# Patient Record
Sex: Male | Born: 1960
Health system: Southern US, Community
[De-identification: ages and names within clinical notes are randomized; demographics above are authoritative.]

## PROBLEM LIST (undated history)

## (undated) DIAGNOSIS — T884XXA Failed or difficult intubation, initial encounter: Secondary | ICD-10-CM

## (undated) DIAGNOSIS — F419 Anxiety disorder, unspecified: Secondary | ICD-10-CM

## (undated) DIAGNOSIS — I499 Cardiac arrhythmia, unspecified: Secondary | ICD-10-CM

## (undated) DIAGNOSIS — E669 Obesity, unspecified: Secondary | ICD-10-CM

## (undated) DIAGNOSIS — I10 Essential (primary) hypertension: Secondary | ICD-10-CM

## (undated) DIAGNOSIS — M1990 Unspecified osteoarthritis, unspecified site: Secondary | ICD-10-CM

## (undated) DIAGNOSIS — I209 Angina pectoris, unspecified: Secondary | ICD-10-CM

## (undated) DIAGNOSIS — I219 Acute myocardial infarction, unspecified: Secondary | ICD-10-CM

## (undated) DIAGNOSIS — G4730 Sleep apnea, unspecified: Secondary | ICD-10-CM

## (undated) DIAGNOSIS — I2510 Atherosclerotic heart disease of native coronary artery without angina pectoris: Secondary | ICD-10-CM

## (undated) DIAGNOSIS — E785 Hyperlipidemia, unspecified: Secondary | ICD-10-CM

## (undated) DIAGNOSIS — K5792 Diverticulitis of intestine, part unspecified, without perforation or abscess without bleeding: Secondary | ICD-10-CM

## (undated) DIAGNOSIS — G709 Myoneural disorder, unspecified: Secondary | ICD-10-CM

## (undated) DIAGNOSIS — J939 Pneumothorax, unspecified: Secondary | ICD-10-CM

## (undated) HISTORY — PX: CORONARY ANGIOPLASTY: SHX604

## (undated) HISTORY — PX: TONSILLECTOMY: SUR1361

## (undated) HISTORY — DX: Hyperlipidemia, unspecified: E78.5

## (undated) HISTORY — DX: Anxiety disorder, unspecified: F41.9

## (undated) HISTORY — DX: Myoneural disorder, unspecified: G70.9

## (undated) HISTORY — PX: CARDIAC CATHETERIZATION: SHX172

## (undated) HISTORY — DX: Unspecified osteoarthritis, unspecified site: M19.90

## (undated) HISTORY — DX: Atherosclerotic heart disease of native coronary artery without angina pectoris: I25.10

## (undated) HISTORY — DX: Obesity, unspecified: E66.9

## (undated) HISTORY — PX: OTHER SURGICAL HISTORY: SHX169

## (undated) HISTORY — DX: Diverticulitis of intestine, part unspecified, without perforation or abscess without bleeding: K57.92

## (undated) HISTORY — PX: JOINT REPLACEMENT: SHX530

---

## 2004-10-01 DIAGNOSIS — S270XXA Traumatic pneumothorax, initial encounter: Secondary | ICD-10-CM | POA: Insufficient documentation

## 2004-10-14 ENCOUNTER — Inpatient Hospital Stay (HOSPITAL_COMMUNITY): Admission: EM | Admit: 2004-10-14 | Discharge: 2004-10-17 | Payer: Self-pay | Admitting: Emergency Medicine

## 2004-10-27 ENCOUNTER — Encounter: Admission: RE | Admit: 2004-10-27 | Discharge: 2004-10-27 | Payer: Self-pay | Admitting: Surgery

## 2004-11-01 DIAGNOSIS — J939 Pneumothorax, unspecified: Secondary | ICD-10-CM

## 2004-11-01 HISTORY — PX: PLEURAL SCARIFICATION: SHX748

## 2004-11-01 HISTORY — DX: Pneumothorax, unspecified: J93.9

## 2005-07-01 ENCOUNTER — Ambulatory Visit: Payer: Self-pay | Admitting: Family Medicine

## 2005-07-08 ENCOUNTER — Encounter: Admission: RE | Admit: 2005-07-08 | Discharge: 2005-07-08 | Payer: Self-pay | Admitting: Family Medicine

## 2005-07-08 ENCOUNTER — Inpatient Hospital Stay (HOSPITAL_COMMUNITY): Admission: AD | Admit: 2005-07-08 | Discharge: 2005-07-13 | Payer: Self-pay | Admitting: Cardiothoracic Surgery

## 2005-07-09 ENCOUNTER — Encounter (INDEPENDENT_AMBULATORY_CARE_PROVIDER_SITE_OTHER): Payer: Self-pay | Admitting: *Deleted

## 2005-07-23 ENCOUNTER — Encounter: Admission: RE | Admit: 2005-07-23 | Discharge: 2005-07-23 | Payer: Self-pay | Admitting: Cardiothoracic Surgery

## 2005-12-26 ENCOUNTER — Emergency Department (HOSPITAL_COMMUNITY): Admission: EM | Admit: 2005-12-26 | Discharge: 2005-12-26 | Payer: Self-pay | Admitting: Family Medicine

## 2006-12-20 ENCOUNTER — Ambulatory Visit: Payer: Self-pay | Admitting: Internal Medicine

## 2006-12-21 ENCOUNTER — Inpatient Hospital Stay (HOSPITAL_COMMUNITY): Admission: EM | Admit: 2006-12-21 | Discharge: 2006-12-24 | Payer: Self-pay | Admitting: Emergency Medicine

## 2007-01-12 ENCOUNTER — Encounter (HOSPITAL_COMMUNITY): Admission: RE | Admit: 2007-01-12 | Discharge: 2007-03-17 | Payer: Self-pay | Admitting: Cardiovascular Disease

## 2007-05-09 ENCOUNTER — Observation Stay (HOSPITAL_COMMUNITY): Admission: RE | Admit: 2007-05-09 | Discharge: 2007-05-10 | Payer: Self-pay | Admitting: Cardiovascular Disease

## 2010-11-21 ENCOUNTER — Encounter: Payer: Self-pay | Admitting: Surgery

## 2011-03-19 NOTE — Op Note (Signed)
NAME:  [name], OUCH NO.:  [id number]   MEDICAL RECORD NO.:  [id number]          PATIENT TYPE:  OBV   LOCATION:  4707                         FACILITY:  [facility]   PHYSICIAN:  Mohan N. Sharyn Lull, M.D. DATE OF BIRTH:  [birth year]   DATE OF PROCEDURE:  12/21/2006  DATE OF DISCHARGE:                               OPERATIVE REPORT   PROCEDURE PERFORMED:  1. Successful percutaneous transluminal coronary angioplasty to 100%      proximal occluded right coronary artery using 2.5 x 12 mm long      Voyager balloon.  2. Successful aspiration of coronary artery thrombus using a Fetch      coronary aspiration catheter.  3. Successful deployment of 3.5 x 33 mm long CYPHER drug-eluting stent      in proximal and mid right coronary artery.  4. Successful post dilatation of CYPHER drug-eluting stent using 4.0 x      18 mm long PowerSail balloon.  5. Successful percutaneous transluminal coronary angioplasty to distal      100% occluded posterolateral branch using a 2.5 x 12 and 2.0 x 8 mm      long Voyager balloons.  6. Successful insertion of temporary transvenous pacemaker via the      right femoral venous approach.   INDICATIONS FOR PROCEDURE:  [name] is a 50 year old white  male with a past medical history significant for hypercholesterolemia;  positive family history of coronary artery disease; tobacco abuse.  He  was admitted on February 19 because of chest pressure associated with  nausea and diaphoresis.  Admission EKG showed minimal ST elevation and Q  wave in lead III, and an incomplete right bundle branch block pattern.  The patient ruled in for MI due to elevated cardiac enzymes and  subsequently underwent cardiac catheterization by Dr. Algie Coffer this  morning and was noted to have 100% occluded RCA.  I was called for PCI  to the RCA.   DESCRIPTION OF PROCEDURE:  After obtaining the informed consent, a 6-  [place] JR-4 guiding catheter was advanced over the  wire under  fluoroscopic guidance up to the ascending aorta.  The wire was pulled,  the catheter was aspirated and connected to the manifold.  The catheter  was further advanced __________ to the right coronary ostium.  Multiple  views of the right system were obtained.   FINDINGS:  The RCA was 100% occluded proximally.  The patient then had  minor collaterals from the left system as per the catheterization  report.   INTERVENTIONAL PROCEDURE:  Successful PTCA to the proximal RCA was done  using a 2.5 x 12 mm long Voyager balloon for pre dilatation.  Angiogram  showed a large filling defect up to the mid portion of the RCA  suggestive of a large burden of thrombus.  Then the Fetch coronary  aspiration catheter was used to aspirate the thrombus.  Two passes were  done.  Angiogram showed near complete resolution of the thrombus.  Then  a 3.5 x 33 mm long CYPHER drug-eluting stent was deployed in the  proximal and mid RCA  at 15 atmospheres of pressure and was post dilated  using a 4.0 x 18 mm long Power Sail balloon, going up to 18-20  atmospheres of pressure.  The lesion was dilated from 100% to 0%  residual with further distal embolization to the PLV branch.   Then successful PTCA to 100% occluded PLV branch was done using  initially a 2.5 x 12 mm long Voyager balloon, and then a 2.0 x 8 mm long  Voyager balloon.  The lesion / thrombus was dilated from 100% to less  than 10% residual with excellent TIMI grade 3 distal flow without  evidence of dissection or further distal embolization.  The patient  received weight-based heparin and 300 mg of Plavix during the procedure  and was continued on 2b/3a  inhibitors, Integrilin, during the procedure.  A temporary pacemaker was  inserted via the right femoral venous approach to the Fetch aspiration  catheter which was removed at the end of the procedure.  The patient  tolerated the procedure well.  There were no complications.  The patient   was transferred to the recovery room in stable condition.           ______________________________  Eduardo Osier Sharyn Lull, M.D.     MNH/MEDQ  D:  12/21/2006  T:  12/22/2006  Job:  [id number]   cc:   Ricki Rodriguez, M.D.  Catheterization Laboratory  Duncan Dull, M.D.

## 2011-03-19 NOTE — Discharge Summary (Signed)
NAME:  Scott Villanueva, Scott Villanueva NO.:  [id number]   MEDICAL RECORD NO.:  [id number]          PATIENT TYPE:  INP   LOCATION:  3313                         FACILITY:  [facility]   PHYSICIAN:  Scott Plane, MD    DATE OF BIRTH:  04-09-1961   DATE OF ADMISSION:  07/08/2005  DATE OF DISCHARGE:                                 DISCHARGE SUMMARY   PRIMARY DIAGNOSIS:  Recurrent left spontaneous pneumothorax.   SECONDARY DIAGNOSIS:  Gastroesophageal reflux disease.   ALLERGIES:  No known drug allergies.   IN-HOSPITAL OPERATIONS AND PROCEDURES:  1.  Left video-assisted thoracoscopic surgery with bleb stapling.  2.  Pleuro vac and mechanical pleurodesis.   HISTORY AND PHYSICAL/HOSPITAL COURSE:  The patient is a 50 year old white  male with history of spontaneous pneumothorax which occurred in December  2005. He was admitted to Dr. Laneta Villanueva at that time and a chest tube was  placed. His lung was re-expanded without problems. He was discharged to home  in good condition. He has been in his usual state of health until since that  time until the past few weeks during which time he has developed increasing  shortness of breath on exertion, left-sided chest discomfort. Initially he  felt like he was having some reflux symptoms and saw his primary care  physician who placed him on Prilosec OTC. He has continued with his regular  activities and it was found that he was increasingly symptomatic. He does a  great deal of heavy lifting as well as walking up and down the steps on his  job and has had more trouble over the past several weeks with increasing  fatigue as well as shortness of breath. He saw his primary care physician on  Thursday for recheck and at that time underwent chest x-ray. This showed a  50% pneumothorax on the left. He was then referred to Dr. Tyrone Villanueva for  further evaluation. He was seen in the office by Dr. Tyrone Villanueva. Dr. Tyrone Villanueva  felt that he should be admitted to [facility] for possible chest  tube placement versus a video-assisted thoracoscopic surgery. The patient  denies any history of fever, chills, cough, hemoptysis, orthopnea, PND, or  shortness of breath at present. For details of the patient's past medical  history, and history and physical exam, please see dictated history and  physical.   HOSPITAL COURSE:  Scott Villanueva was admitted to [facility] on  July 08, 2005. On July 08, 2005, Dr. Tyrone Villanueva discussed with patient  placing the chest tube at that time. The patient wished not to have that  done, but prefer just to undergo the VAT. Due to the patient being  clinically stable, Dr. Tyrone Villanueva did not place a chest tube on admission. The  patient had no respiratory distress overnight. He was taken to the operating  room on July 09, 2005, where he underwent left video-assisted  thoracoscopic surgery with bleb stapling, pleural biopsy, and mechanical  pleurodesis. The patient tolerated the procedure well and transferred up to  the intensive care unit in stable condition. On postoperative day one, the  patient seemed to have a low-grade temperature of 100.9. He was encouraged  to do his incentive spirometry. Chest x-ray showed small left pneumothorax.  The patient had minimal drainage from the chest tube with no air leak. He  was hemodynamically stable. The patient was out of bed, ambulating on day  one. On postoperative day #2, the patient had a low-grade temperature of  101.6.  Again, he continued to use his incentive spirometer. Chest x-ray  showed no pneumothorax with improved bibasilar aeration. The patient did  desaturate to 80% on room air overnight. Currently, on day two he is  saturating 94% on room air.  Chest tube was placed to water seal. No air  leak was noted. On postoperative day three, the patient was without  complaints. He was out of bed, ambulating well. Appetite was improving. No  complaints of shortness  of breath. He was saturating 96% on room air. Chest  x-ray showed a stable small left anterior hydropneumothorax. Pleural biopsy  was pending at this time. Chest tube was discontinued and he was transferred  down to 2000.   The patient was tentatively ready for discharge home on July 13, 2005,  postoperative day four, i.e., if chest x-ray following discharge is stable.  A follow-up appointment is scheduled with Dr. Donata Villanueva for July 23, 2005, at 12:30 p.m. The patient will obtain a PA and lateral chest x-ray one  hour prior to his appointment. Scott Villanueva received instructions on diet,  activity level, and incisional care. He was told no driving until released  to do so, no heavy lifting over 10 pounds. He is told to ambulate three to  four times per day, progress as tolerated. He is told to continue doing his  breathing exercises. The patient acknowledges understanding.  The patient  was educated on the importance of quitting smoking. He said he was going to  quit. The patient is told he is allowed to shower, wash his incisions using  soap and water. He is to contact the office if he develops any drainage or  swelling from any of his incision sites.   MEDICATIONS:  1.  Prilosec OTC daily.  2.  Multivitamin daily.  3.  Vitamin B12.  4.  Fish oil daily.  5.  Tylox one to two tabs p.o. q.4-6h. p.r.n. pain.      Scott Villanueva, [place]      Scott Plane, MD  Electronically Signed    KMD/MEDQ  D:  07/12/2005  T:  07/12/2005  Job:  [id number]

## 2011-03-19 NOTE — Discharge Summary (Signed)
NAME:  NYMIR, RINGLER NO.:  [id number]   MEDICAL RECORD NO.:  [id number]          PATIENT TYPE:  INP   LOCATION:  2039                         FACILITY:  [facility]   PHYSICIAN:  Duncan Dull, M.D.     DATE OF BIRTH:  June 26, 1961   DATE OF ADMISSION:  12/20/2006  DATE OF DISCHARGE:  12/24/2006                               DISCHARGE SUMMARY   DISCHARGE DIAGNOSES:  1. Acute NSTEMI status post percutaneous transluminal coronary      angioplasty with a drug-eluting stent placed in the right coronary      artery and angioplasty of the distal posterior lateral branch.  2. Hyperlipidemia.  3. History of spontaneous pneumothorax x2 status post video assisted      thoracoscopy with bleb stapling in Septemberof 2006 and      pleurodesis.   DISCHARGE MEDICATIONS:  1. Norvasc 2.5 mg daily.  2. Altace 2.5 mg daily.  3. Lopressor 25 mg b.i.d.  4. Lipitor 80 mg daily.  5. Aspirin 81 mg daily.  6. Plavix 75 mg daily.  7. Nitroglycerin 0.4 mg sublingual q. 5 minutes x3 doses p.r.n. chest      pain.   CONDITION AT DISCHARGE:  Stable and improved.  The patient was  instructed to follow up with Dr. Algie Coffer 2 weeks after discharge and was  provided the phone number to make his own appointment.  The patient was  also instructed to follow up in the [facility] outpatient clinic, and he  was told that he will be called with an appointment.  The patient was  given instructions to follow a low sodium heart healthy diet.  He was to  increase his activity slowly.  He was instructed on how to care for his  groin wound from the catheterization.   PROCEDURES:  1. Chest x-ray on December 20, 2006 revealed cardiomegaly with a      lingular scar.  No acute findings.  2. Cardiac catheterization on December 21, 2006 revealed the RCA was      100% occluded proximally.  A Cypher drug-eluting stent was deployed      in the proximal mid RCA.  The lesion was dilated from 100% to 0%      residual  with further distal embolization to the PLV branch.  Then      a successful PTCA to the 100% occluded PLV branch was done.      Temporary pacemaker was inserted, which was removed prior to      discharge.   CONSULTANTS:  Dr. Algie Coffer from cardiology.   ADMISSION HISTORY AND PHYSICAL:  Please see the chart for full details.  In summary, Mr. Hepp is a 50 year old man with a history of  pneumothorax x 2 status post pleurodesis who presented with acute onset  of left-sided chest pain.  The patient was in his normal state of health  when he awoke with left lateral chest wall/substernal chest pressure at  about 3:45 in the morning.  This was 9/10 and associated with nausea,  cool clammy sensation with left upper shoulder numbness/tingling.  He  took  2 aspirin and 2 Tylenol immediately and took a shower.  As the  situation did not change, he came by private vehicle to the emergency  room.  He states at 1600 hours he ate several pieces of pizza and 2  [employer] beers and went to sleep the night prior to admission.  Over the  last couple of days, he has been getting very little sleep due to his  wife being in the hospital for pneumonia.  He stated the pain was  incredibly intense; however, the patient denies shortness of breath,  diaphoresis or vomiting.  Nothing relieved the pain prior to arrival in  the emergency department.  Once the patient was in the emergency  department, he received sublingual nitroglycerin, and subsequently he  felt weak and dizzy, became hypotensive and bradycardic which resolved  with fluids and rest in the Trendelenburg position.   PHYSICAL EXAMINATION ON ADMISSION:  VITAL SIGNS:  Temperature was 97.7,  blood pressure 102/73, pulse of 51, respiration rate 19, and he was  saturating 100% on room air.  GENERAL:  Pertinent physical exam findings include he was in mild  distress.  LUNGS:  Clear.  On the left lateral chest wall and posterior thorax to  the left chest  wall there were 3 scars from previous surgery.  CARDIAC:  A bradycardic but regular rhythm, no murmurs, and he had  strong peripheral pulses.  ABDOMEN:  Benign.  EXTREMITIES:  There was no edema.  He had no rashes.  NEUROLOGIC:  Nonfocal.   LABORATORY DATA:  On admission, sodium 139, potassium 4.2, chloride 108,  bicarbonate 25, BUN 15, creatinine 1.0, glucose 115, bilirubin 0.6,  alkaline phosphatase 60, AST 22, ALT 27, protein 6.3, albumin 3.5,  calcium 8.8.  White blood cell count 8.9, hemoglobin 14.8, platelets  236, MCV 92.  PT 13.5, PTT 26, INR 1.0.  D-dimer less than 0.22.  A UDS  was positive for opiates (the patient received morphine in the ED prior  to the UDS being drawn).  Total cholesterol was 222, HDL 31, LDL 121,  triglycerides 348.  UA was negative.  Point of care markers initially  were negative.   HOSPITAL COURSE:  1. Non-ST segment elevation MI.  The patient ruled in for NSTEMI with      a peak troponin of 13.92.  He was found on cardiac catherization      to have a 100% occluded RCA which was opened with a drug-eluting      stent that was deployed and a 100% occluded posterior lateral      branch that was angioplastied successfully.  The patient recovered      well after the catheterization and was discharged on Plavix 75 mg      daily to take for at least the next year, as well as aspirin,      Lipitor, Lopressor, Altace and Norvasc.  The patient was also given      nitroglycerin to take on a p.r.n. basis.  He was instructed to      follow up with Dr. Algie Coffer in the outpatient setting.   1. Hyperlipidemia:  Patient's cholesterol at admission was not      controlled as evidenced by his fasting lipid panel with a total      cholesterol of 222, triglycerides 348, HDL 31, LDL 121.  He was      discharged on Lipitor 80 mg daily for further risk modification.      He will  need LFTs and repeat fasting lipids at 6 weeks from     discharge to monitor for adverse effects  and efficacy.   1. History of spontaneous pneumothoraces x2.  The etiology of his      historical lung disease  was unclear.  A slightly elevated  alpha I      antitrypsin level pointed against alpha 1 AT deficiency as the      cause of his pneumothoraces.  This may need to be explored further      in the outpatient setting should he have another pneumothorax.   1. Mild febrile illness.  The patient 's TMAX after his PTCA was      100.2.  He received 3 doses of Ancef, remained afebrile and was not      discharged on antibiotics.  His UA was negative, his blood cultures      were negative and a chest x-ray did not reveal any pneumonia.  The      fever was felt to be possibly secondary to nicotine withdrawal.      The patient felt fine and was afebrile on the day of discharge.   DISCHARGE LABORATORIES AND VITAL SIGNS:  On the day of discharge, his  temperature was 98.5, pulse 83, respiration rate 18, blood pressure  103/65, and he was saturating at 95% on room air.  His white blood cell  count was 8, hemoglobin 14.3, platelets 210.  Sodium 139, potassium 4.3,  chloride 105, bicarbonate 25, BUN 14,  creatinine 1.09, glucose 88.  A1c was 5.7.  TSH 1.176.  He was FOBT  positive; however, this was in the setting of no anemia and recent  anticoagulation with heparin.   PENDING LABORATORIES:  None.      Chauncey Reading, D.O.  Electronically Signed      Duncan Dull, M.D.  Electronically Signed    EA/MEDQ  D:  03/16/2007  T:  03/16/2007  Job:  [id number]

## 2011-03-19 NOTE — H&P (Signed)
NAME:  Scott Villanueva, Scott Villanueva NO.:  [id number]   MEDICAL RECORD NO.:  [id number]          PATIENT TYPE:  INP   LOCATION:  5738                         FACILITY:  [facility]   PHYSICIAN:  Sheliah Plane, MD    DATE OF BIRTH:  [birth year]   DATE OF ADMISSION:  07/08/2005  DATE OF DISCHARGE:                                HISTORY & PHYSICAL   CHIEF COMPLAINT:  Left-sided chest pain.   HISTORY OF PRESENT ILLNESS:  The patient is a 50 year old white male with a  history of spontaneous pneumothorax which occurred in December 2005. He was  admitted to Dr. Laneta Simmers at that time and a chest tube was placed. His lung re-  expanded without problem. He was discharged home in good condition. He has  been in his usual state of health since that time until the past few weeks  during which time he has developed increasing shortness of breath on  exertion and left-sided chest discomfort. Initially, he felt he was having  some reflux symptoms and saw his primary care physician who placed him on  Prilosec OTC. He has continued with his regular activities but has found  that he was becoming increasing symptomatic. He does a great deal of heavy  lifting as well as walking up and down the steps on his job and has had more  trouble over the past several weeks with increasing fatigue as well as  shortness of breath. He saw his primary care physician on Thursday for  recheck and at that time underwent a chest x-ray. This showed a 50%  pneumothorax on the left. He was subsequently referred to Dr. Tyrone Sage for  further evaluation. He was seen in our office today and it was Dr.  Dennie Maizes opinion that he should be admitted at this time for chest tube  placement versus a VATs. Of note, the patient denies any history of fevers,  chills, cough, hemoptysis, orthopnea, PND, or shortness of breath at rest.   PAST MEDICAL HISTORY:  Gastroesophageal reflux disease.  He denies any  history of coronary artery  disease, diabetes mellitus, hypertension, COPD,  CVA.   PAST SURGICAL HISTORY:  Negative.   MEDICATIONS:  1.  Prilosec OTC.  2.  Multivitamin one q.d.  3.  Vitamin B12 q.d.  4.  Fish oil q.d.   ALLERGIES:  No known drug allergies.   SOCIAL HISTORY:  He is married and resides with wife. He previously smoked a  pack of cigarettes per month and smoked off and on for 34 years. He has had  no tobacco in the past month. He also consumes alcohol occasionally. He is  employed at a [employer].   FAMILY HISTORY:  Family history on his father's of the family is unknown.  The patient's mother is alive and well with no chronic medical problems.  There is no family history of which he is aware of coronary artery disease,  diabetes mellitus, hypertension, COPD, cancer, or CVA.   REVIEW OF SYSTEMS:  See history of present illness for pertinent positives  and negatives. He is otherwise in good  health. He denies any fevers, chills,  recent infections, unexplained weight loss, TIA symptoms, visual changes,  syncope, heart palpitations, pulmonary symptoms as above, abdominal pain,  nausea, vomiting, diarrhea, constipation, hematemesis, hemoptysis,  hematochezia, melena, hematuria, nocturia, dysuria, lower extremity edema,  lower extremity claudication symptoms, rest pain, nonhealing ulcers,  anxiety, depression, intolerance to heat or cold, muscle pain, joint pain,  or muscle weakness.   PHYSICAL EXAMINATION:  VITAL SIGNS:  Blood pressure is 124/77, heart rate  95, respirations 20, temperature 97.6.  GENERAL:  This is a well-developed, well-nourished white male in no acute  distress.  HEENT:  Normocephalic, atraumatic. Pupils equal, round and react to light  and accommodation. Extraocular movements intact. Exam of the external ears  and nose reveal no abnormalities. Oropharynx clear.  NECK:  Supple without lymphadenopathy, thyromegaly or carotid bruits.  HEART:  Regular rate and  rhythm without murmurs, rubs or gallops.  LUNGS:  Decreased breath sounds in the left base; the right clear.  ABDOMEN:  Soft, obese, nontender, nondistended with active bowel sounds in  all quadrants. No masses or hepatosplenomegaly.  EXTREMITIES:  No clubbing, cyanosis or edema. He has 2+ femoral, dorsalis  pedis and posterior tibial pulses bilaterally.  NEUROLOGICAL:  Cranial nerves II-XII grossly intact. He is alert and  oriented x3.   ASSESSMENT/PLAN:  This is a 50 year old white male with a recurrent left  spontaneous pneumothorax. Dr. Dorris Fetch has seen and evaluated the patient  this evening and he has elected to avoid chest tube placement and to proceed  with a VATs. He will be scheduled for left VATs on Friday and July 09, 2005 by Dr. Tyrone Sage. Dr. Tyrone Sage will see the patient later this evening  and go over the procedures risks, benefits and alternatives.      Coral Ceo, P.A.      Sheliah Plane, MD  Electronically Signed    GC/MEDQ  D:  07/08/2005  T:  07/09/2005  Job:  [id number]   cc:   Sheliah Plane, MD  [address]  [place]  [place] [address]   Tinnie Gens A. Tawanna Cooler, M.D. [facility]  [address] [place]  [place] [address]

## 2011-03-19 NOTE — Discharge Summary (Signed)
NAME:  EVON, LOPEZPEREZ NO.:  [id number]   MEDICAL RECORD NO.:  [id number]          PATIENT TYPE:  INP   LOCATION:  5015                         FACILITY:  [facility]   PHYSICIAN:  Evelene Croon, M.D.     DATE OF BIRTH:  May 13, 1961   DATE OF ADMISSION:  10/14/2004  DATE OF DISCHARGE:  10/17/2004                                 DISCHARGE SUMMARY   ADMISSION DIAGNOSIS:  Spontaneous left pneumothorax.   PAST MEDICAL HISTORY:  Negative for any medical or surgical illnesses or  hospitalizations.   ALLERGIES:  No known drug allergies.   DISCHARGE DIAGNOSIS:  Left pneumothorax, treated with chest tube, resolved.   HISTORY OF PRESENT ILLNESS:  Mr. Hendrickson is a 50 year old Caucasian man. He  reported having left sided anterior chest pain for quite some time. On  October 13, 2004 he had a chest x-ray done as a requirement for a new job.  This showed a 30% left pneumothorax. Mr. Schor has also been having left  sided chest pain this week. He is evaluated by Dr. Laneta Simmers, who recommended  admission to the hospital and placement of a left chest tube. Mr. Garriga  agreed with this plan.   HOSPITAL COURSE:  On October 14, 2004, Mr. Adduci was admitted to [facility] under the care of Dr. Evelene Croon. A 20 French left chest  tube was placed without difficulty. Chest x-ray revealed near complete re-  expansion of his left lung. Mr. Rylee remained stable. His daily chest x-  rays were stable as well. On October 15, 2004, chest tube was placed to  water seal. Repeat chest x-ray the morning of October 16, 2004 revealed his  lung to remain well expanded. His chest tube was removed today, October 16, 2004. The plan is for repeat chest x-ray this afternoon, repeat portable  chest x-ray this afternoon and PA and lateral x-ray in the morning. If these  studies are stable, he will be discharged home tomorrow, October 17, 2004.   CONDITION ON DISCHARGE:  Improved.   DISCHARGE  MEDICATIONS:  He may have Tylox 1 to 2 p.o. q. 4 hours p.r.n. for  moderate to severe pain.   ACTIVITY:  He has been asked to refrain from any heavy lifting until he is  seen again by Dr. Laneta Simmers.   DIET:  Not restricted.   WOUND CARE:  He may shower beginning Sunday, October 18, 2004.   FOLLOW UP:  Dr. Laneta Simmers wound like to see him back in the [facility] office on  Tuesday, October 27, 2004 at 12:45 in the afternoon. He has to have a chest  x-ray at [facility] at 11:45 that morning.      Ranelle Oyster, M.D.  Electronically Signed  Zach   ZTS/MEDQ  D:  10/16/2004  T:  10/17/2004  Job:  [id number]   cc:   Toribio Harbour, N.P.   Evelene Croon, M.D.  [address]  [place]  [place] [address]  Fax: [phone]

## 2011-03-19 NOTE — Op Note (Signed)
NAME:  Scott Villanueva, Scott Villanueva NO.:  [id number]   MEDICAL RECORD NO.:  [id number]          PATIENT TYPE:  INP   LOCATION:  3313                         FACILITY:  [facility]   PHYSICIAN:  Sheliah Plane, MD    DATE OF BIRTH:  May 17, 1961   DATE OF PROCEDURE:  07/09/2005  DATE OF DISCHARGE:                                 OPERATIVE REPORT   PREOPERATIVE DIAGNOSIS:  Recurrent spontaneous pneumothorax, left.   POSTOPERATIVE DIAGNOSIS:  Recurrent spontaneous pneumothorax, left.   SURGICAL PROCEDURE:  Left video-assisted thoracoscopy with stapling of blebs  and mechanical pleurodesis.   SURGEON:  Sheliah Plane, M.D.   FIRST ASSISTANT:  Rowe Clack, P.A.-C.   BRIEF HISTORY:  The patient is a 50 year old male who had in late 2005  presented to Dr. Evelene Croon with a spontaneous left pneumothorax.  This  was treated with a lap chest tube with resolution.  The patient had done  well until the day prior to surgery when he presented with left chest pain.  A chest x-ray revealed an approximately a 50% pneumothorax on the left.  Because of the patient's second recurrence on the same side, a video-  assisted thoracoscopy and stapling of presumed blebs was recommended to the  patient who agreed and signed informed consent.   DESCRIPTION OF THE PROCEDURE:  The patient underwent general endotracheal  anesthesia with double-lumen endotracheal tube.  The position of the tube  was confirmed with bronchoscope.  The patient was then turned in the lateral  decubitus position with the left side up.  Initially, a single port was  introduced in the malleolus at approximately the fifth intercostal space.  With the 30-degree scope, the chest was explored.  There was a significant  amount of pleural reaction at the apex.  This was biopsied and was benign.  There were no apical blebs appreciated, but in lower edge of the upper lobe,  there was a 2 cm bleb plus a smaller bleb and also involving  the lower  portion of the upper lobe.  Ports were then placed anteriorly and  posteriorly through the three port sites.  The camera was held, and the  largest of the blebs was grasped and stapled with a [employer]  Endostapler.  The second small bleb was then stapled across the base with a  no-knife stapler.  Saline was introduced into the chest, and no obvious leak  was evident.  Saline was removed.  A chest tube placed through the  midaxillary line port site, and the other two port sites were closed with  interrupted 0 Vicryl in the subcutaneous tissue and 4-0 subcuticular stitch  in the skin edges.  The lung was allowed to reinflate, and chest tube placed  to suction.  After a short period of observation, there was no further air  leak from the chest or via the chest tube.  A dry dressing was applied.  The  patient was awakened and extubated in the operating room and then  transferred to the recovery room for further postoperative care.   ESTIMATED BLOOD LOSS:  100 mL.  COUNTS:  Patient's sponge and needle count was reported as correct at the  completion of the procedure.  The patient tolerated the procedure without  obvious complication.      Sheliah Plane, MD  Electronically Signed     EG/MEDQ  D:  07/12/2005  T:  07/12/2005  Job:  [id number]

## 2011-03-19 NOTE — Discharge Summary (Signed)
NAME:  Scott Villanueva, Scott Villanueva NO.:  [id number]   MEDICAL RECORD NO.:  [id number]          PATIENT TYPE:  OBV   LOCATION:  2029                         FACILITY:  [facility]   PHYSICIAN:  Ricki Rodriguez, M.D.  DATE OF BIRTH:  13-Nov-1960   DATE OF ADMISSION:  05/09/2007  DATE OF DISCHARGE:  05/10/2007                               DISCHARGE SUMMARY   FINAL DIAGNOSES:  1. Chest pain.  2. Old myocardial infarction.  3. Native vessel coronary artery disease.  4. Obesity.  5. Tobacco use disorder.   DISCHARGE MEDICATIONS:  1. Aspirin 325 mg one daily.  2. Plavix 75 mg one daily.  3. Metoprolol 25 mg one twice daily.  4. Lipitor 80 mg one daily.  5. Norvasc 2.5 mg one daily.  6. Nitroglycerin 0.4 mg tablet once sublingual every 5 minutes x3 as      needed for chest pain.   DISCHARGE DIET:  Low-sodium, heart-healthy diet.   ACTIVITY:  The patient to resume activity slowly and to avoid any  activity that causes chest pain, shortness of breath, dizziness,  sweating or excessive weakness.   ADDITIONAL INFORMATION:  The patient advised to lose a few pounds by  diet and activity every month until he reaches ideal cardiac weight.   FOLLOWUPRicki Rodriguez, M.D. in 2 weeks.  The patient to call [phone] for appointment.   HISTORY:  This is a 50 year old white male with a 1-week history of  recurrent chest pain.  He had inferior wall MI with stent placement in  RCA in February 2008. Admits to some drug and food noncompliance   PHYSICAL EXAMINATION:  VITAL SIGNS:  Pulse 61, respirations 15, blood  pressure 115/82. The patient is 5 feet 6 inches tall and weighs  approximately 230 pounds.  HEENT: The patient is normocephalic, atraumatic with pupils equal,  round, and reactive to light.  Conjunctivae pink.  Sclerae nonicteric.  NECK:  No JVD.  LUNGS:  Clear bilaterally.  HEART:  Normal S1 and S2.  ABDOMEN:  Distended but nontender.  EXTREMITIES:  No edema.   LABORATORY  DATA:  Revealed a hemoglobin of 14.4, hematocrit of 41.7  normal WBC and platelet count. Normal CK-MB and troponin-I.   EKG:  Normal sinus rhythm.   Nuclear stress test:  Normal ejection fraction measuring about 55% with  no evidence of defect except for a small infarct or a fixed defect of  the anterior wall.   HOSPITAL COURSE:  The patient was admitted to telemetry unit. Myocardial  infarction was ruled out.  He underwent a nuclear stress test that  failed to show any reversible ischemia.  Hence, his medications were  adjusted, and he was discharged home in satisfactory condition with  followup by me in 2 weeks.      Ricki Rodriguez, M.D.  Electronically Signed     ASK/MEDQ  D:  08/11/2007  T:  08/11/2007  Job:  [id number]

## 2011-03-19 NOTE — Cardiovascular Report (Signed)
NAME:  Scott Villanueva, RUMBLE NO.:  [id number]   MEDICAL RECORD NO.:  [id number]          PATIENT TYPE:  INP   LOCATION:  2039                         FACILITY:  [facility]   PHYSICIAN:  Ricki Rodriguez, M.D.  DATE OF BIRTH:  June 17, 1961   DATE OF PROCEDURE:  12/21/2006  DATE OF DISCHARGE:  12/24/2006                            CARDIAC CATHETERIZATION   HOSPITAL LOCATION:  2039, bed 1.   REFERRING PHYSICIAN:  Duncan Dull, M.D.   PROCEDURES PERFORMED:  1. Left heart catheterization.  2. Selective coronary angiography  3. Left renal study.   INDICATIONS FOR PROCEDURE:  This 50 year old white male had chest pain  along with acute inferior wall myocardial infarction.   Approached right femoral artery using a 6-French sheath and right  femoral vein using a 7-French sheath.   COMPLICATIONS:  None.   HEMODYNAMIC DATA:  The aortic pressure was 89/68 and left ventricular  crest was 95/11.   Coronary anatomy:  The left main coronary artery was unremarkable.   Left anterior descending coronary artery:  The left anterior descending  coronary artery showed proximal LAD at 40% occlusion and diagonal 2  vessel was unremarkable.  Diagonal 1 had an osteal 30% stenosis.   Ramus branch was large and unremarkable.   Left circumflex coronary artery was small and had a small obtuse  marginal branch without significant disease.   Right coronary artery:  The right coronary artery had total occlusion  proximally, post marginal branch origin involving the marginal branch  showing osteal 80% stenosis.  The posterolateral and posterior  descending coronary artery were unremarkable and distal 1/3 of RCA and  right coronary artery vessel, left anterior descending, and left  circumflex coronary artery.   Left ventriculogram:  The left ventriculogram showed inferior basilar  akinesia with an ejection fraction of 40% to 45%.   IMPRESSION:  1. Total occlusion of right coronary artery.  2.  Inferior wall akinesia.   RECOMMENDATIONS:  This patient is to undergo percutaneous transluminal  coronary angioplasty stent placement in the right coronary artery by Dr.  Rinaldo Cloud.      Ricki Rodriguez, M.D.  Electronically Signed     ASK/MEDQ  D:  04/12/2007  T:  04/13/2007  Job:  [id number]

## 2011-06-15 ENCOUNTER — Emergency Department: Payer: Self-pay | Admitting: Unknown Physician Specialty

## 2011-08-17 LAB — PROTIME-INR
INR: 0.9
Prothrombin Time: 12.3

## 2011-08-17 LAB — CBC
MCHC: 34.6
RBC: 4.57
WBC: 6.9

## 2011-08-17 LAB — TROPONIN I: Troponin I: 0.06

## 2011-08-17 LAB — CK TOTAL AND CKMB (NOT AT ARMC)
CK, MB: 1.7
CK, MB: 1.9
Relative Index: INVALID
Total CK: 78

## 2011-08-17 LAB — APTT: aPTT: 30

## 2011-09-26 ENCOUNTER — Emergency Department: Payer: Self-pay | Admitting: Unknown Physician Specialty

## 2014-01-09 DIAGNOSIS — Z9989 Dependence on other enabling machines and devices: Secondary | ICD-10-CM | POA: Insufficient documentation

## 2014-09-08 ENCOUNTER — Emergency Department: Payer: Self-pay | Admitting: Emergency Medicine

## 2014-09-08 LAB — URINALYSIS, COMPLETE
BILIRUBIN, UR: NEGATIVE
Bacteria: NONE SEEN
Blood: NEGATIVE
Glucose,UR: NEGATIVE mg/dL (ref 0–75)
KETONE: NEGATIVE
Leukocyte Esterase: NEGATIVE
NITRITE: NEGATIVE
Ph: 5 (ref 4.5–8.0)
Protein: NEGATIVE
RBC,UR: 2 /HPF (ref 0–5)
SQUAMOUS EPITHELIAL: NONE SEEN
Specific Gravity: 1.031 (ref 1.003–1.030)

## 2014-09-08 LAB — CBC WITH DIFFERENTIAL/PLATELET
BASOS ABS: 0 10*3/uL (ref 0.0–0.1)
Basophil %: 0.4 %
EOS PCT: 1.1 %
Eosinophil #: 0.1 10*3/uL (ref 0.0–0.7)
HCT: 43.4 % (ref 40.0–52.0)
HGB: 15 g/dL (ref 13.0–18.0)
LYMPHS ABS: 1.9 10*3/uL (ref 1.0–3.6)
Lymphocyte %: 21.8 %
MCH: 33.2 pg (ref 26.0–34.0)
MCHC: 34.6 g/dL (ref 32.0–36.0)
MCV: 96 fL (ref 80–100)
Monocyte #: 0.6 x10 3/mm (ref 0.2–1.0)
Monocyte %: 7.1 %
NEUTROS PCT: 69.6 %
Neutrophil #: 6.1 10*3/uL (ref 1.4–6.5)
Platelet: 189 10*3/uL (ref 150–440)
RBC: 4.53 10*6/uL (ref 4.40–5.90)
RDW: 12.4 % (ref 11.5–14.5)
WBC: 8.8 10*3/uL (ref 3.8–10.6)

## 2014-09-08 LAB — COMPREHENSIVE METABOLIC PANEL
ALBUMIN: 3.5 g/dL (ref 3.4–5.0)
ALT: 35 U/L
AST: 20 U/L (ref 15–37)
Alkaline Phosphatase: 90 U/L
Anion Gap: 7 (ref 7–16)
BUN: 19 mg/dL — AB (ref 7–18)
Bilirubin,Total: 0.4 mg/dL (ref 0.2–1.0)
CALCIUM: 8.1 mg/dL — AB (ref 8.5–10.1)
CHLORIDE: 105 mmol/L (ref 98–107)
CO2: 31 mmol/L (ref 21–32)
CREATININE: 1.36 mg/dL — AB (ref 0.60–1.30)
GFR CALC NON AF AMER: 59 — AB
Glucose: 127 mg/dL — ABNORMAL HIGH (ref 65–99)
OSMOLALITY: 289 (ref 275–301)
POTASSIUM: 4.1 mmol/L (ref 3.5–5.1)
SODIUM: 143 mmol/L (ref 136–145)
TOTAL PROTEIN: 7.1 g/dL (ref 6.4–8.2)

## 2016-06-17 IMAGING — CT CT ABD-PELV W/O CM
2 of 4 series · 16 of 46 positions shown, 18 images · non-contrast
Comparison: None.

CLINICAL DATA: Initial evaluation for left lower quadrant pain. No
hematuria. Normal white blood cell count.

EXAM:
CT ABDOMEN AND PELVIS WITHOUT CONTRAST
TECHNIQUE: Multidetector CT imaging of the abdomen and pelvis was performed
following the standard protocol without IV contrast.

[Series 2: stone standard full · axial · 0.84mm/px · z∈[-443,-33]mm · 13 of 90 slices shown, 15 images]
[im 4/90  soft-tissue]
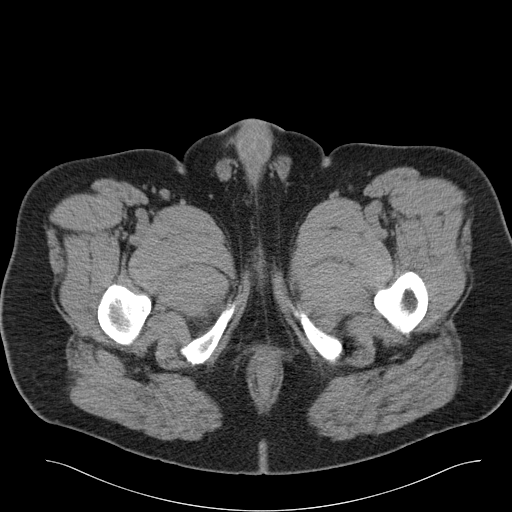
[im 4/90  bone]
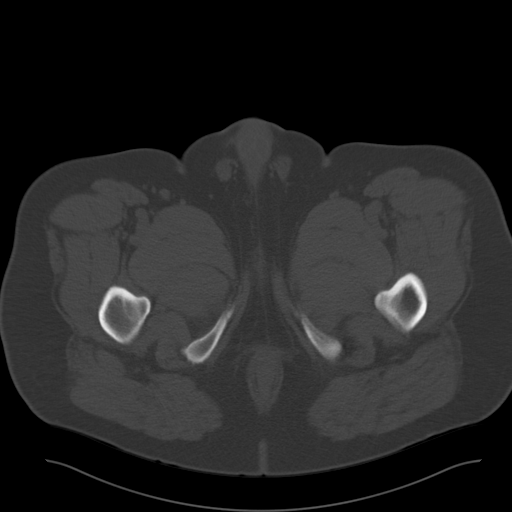
[im 12/90  soft-tissue]
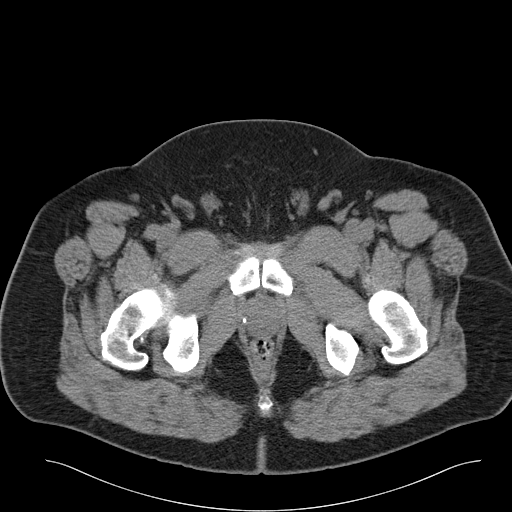
[im 19/90  soft-tissue]
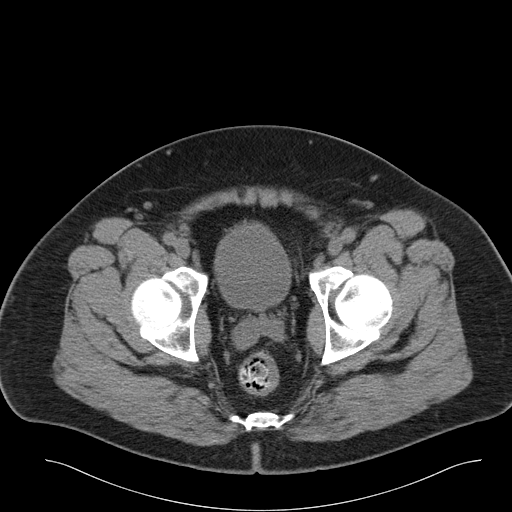
[im 26/90  soft-tissue]
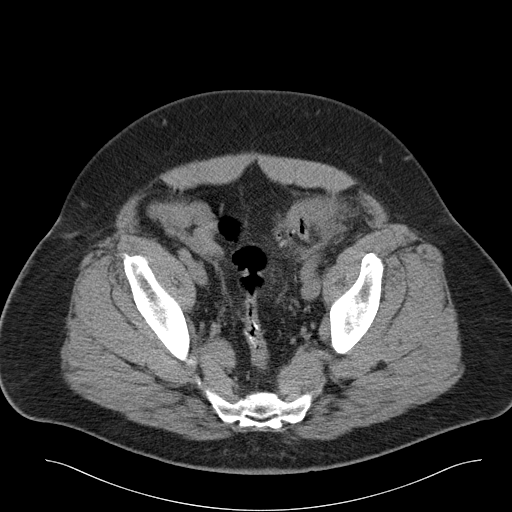
[im 30/90  soft-tissue]
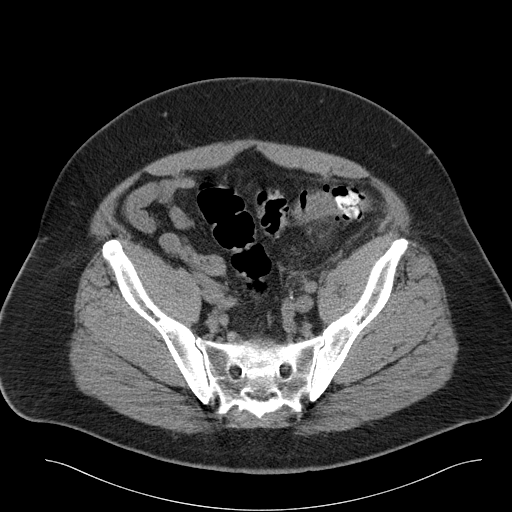
[im 38/90  soft-tissue]
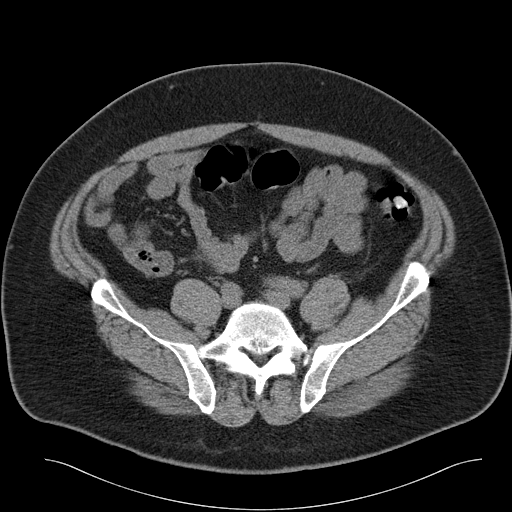
[im 45/90  soft-tissue]
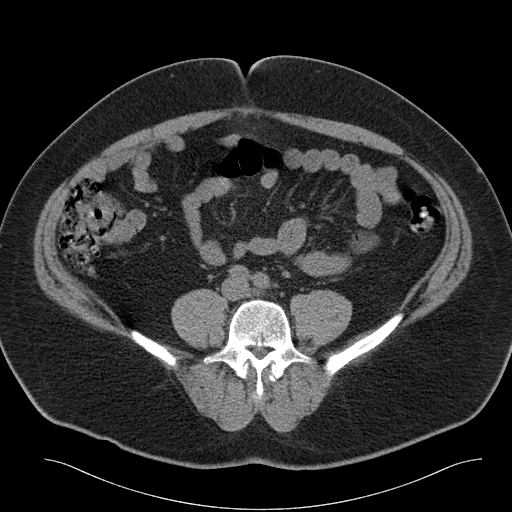
[im 52/90  soft-tissue]
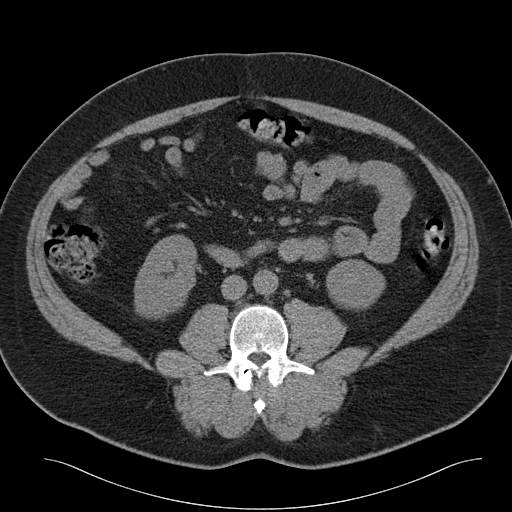
[im 60/90  soft-tissue]
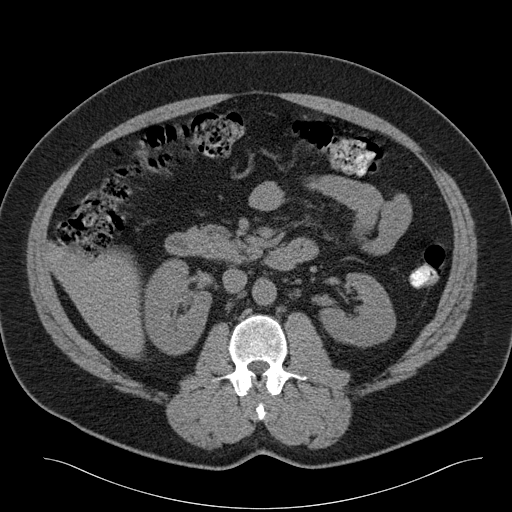
[im 60/90  bone]
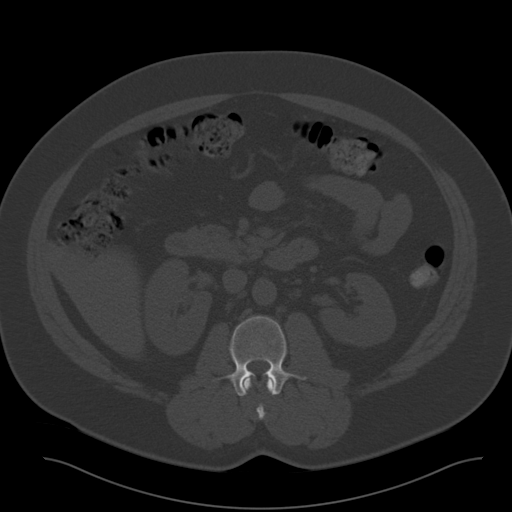
[im 64/90  soft-tissue]
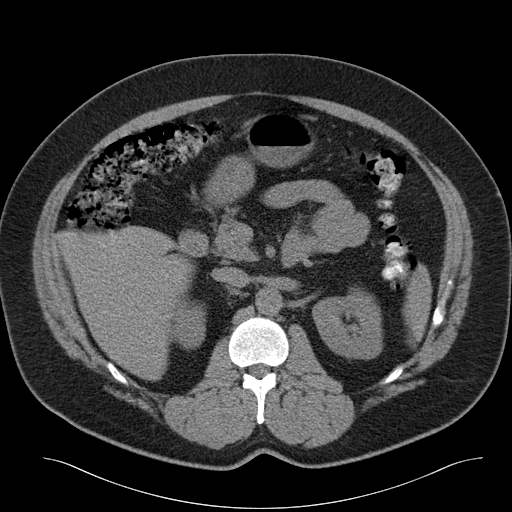
[im 71/90  soft-tissue]
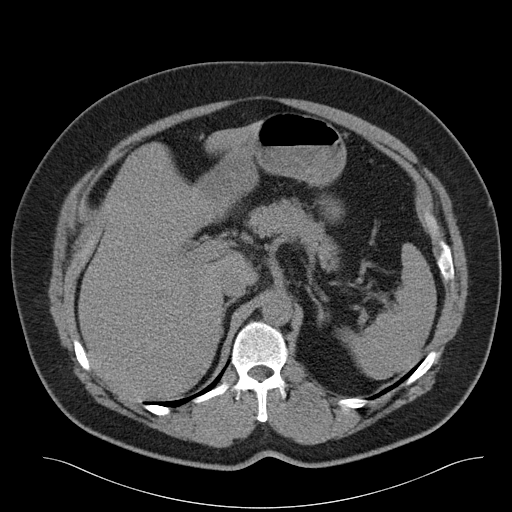
[im 78/90  soft-tissue]
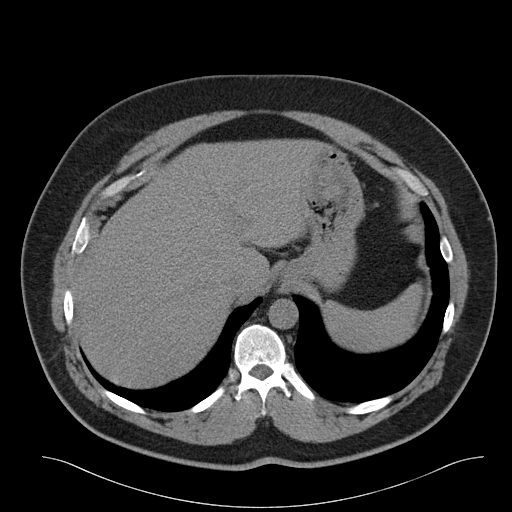
[im 86/90  soft-tissue]
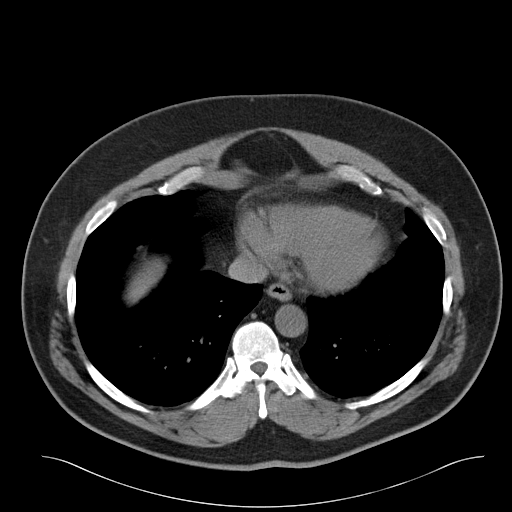

[Series 5: cor stone standard full · coronal · 0.79mm/px · 3 of 157 slices shown]
[im 53/157  soft-tissue]
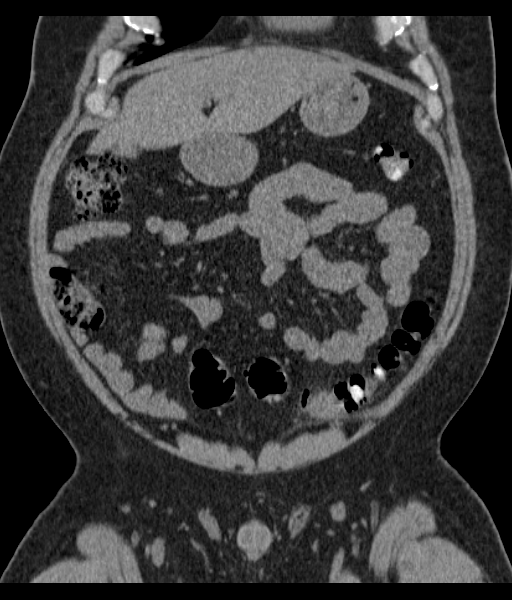
[im 70/157  soft-tissue]
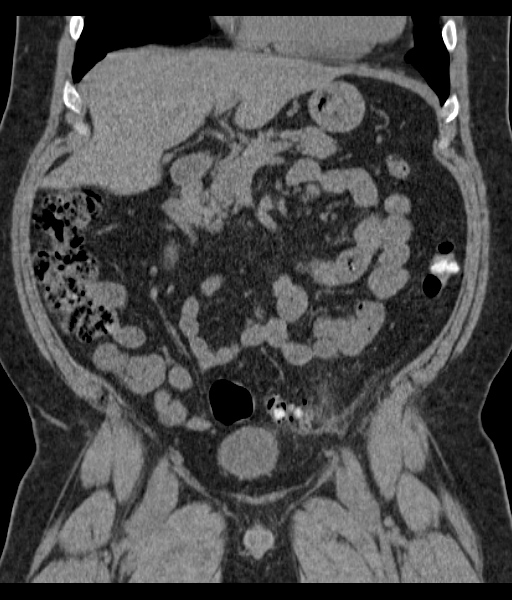
[im 87/157  soft-tissue]
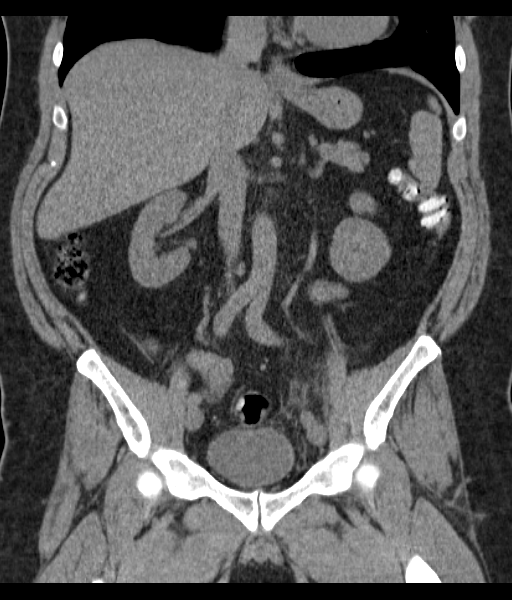

[16 of 46 positions shown; findings below may reference images not displayed]

FINDINGS: Scattered emphysematous changes noted at the visualized lung bases.
No pleural or pericardial effusion.

The liver demonstrates a normal unenhanced appearance. Gallbladder
within normal limits. No biliary dilatation. Spleen, adrenal glands,
and pancreas within normal limits.

Kidneys are equal in size without evidence of nephrolithiasis or
hydronephrosis. No stones seen along the course of either renal
collecting system. There is no hydroureter.

Stomach within normal limits. No evidence for bowel obstruction.
Appendix well visualized in the right lower quadrant and is of
normal caliber and appearance without associated inflammatory
changes to suggest acute appendicitis. There is inflammatory fat
stranding about multiple diverticula within the sigmoid colon in the
left lower quadrant, compatible with acute diverticulitis. No
evidence for perforation or abscess.

Bladder within normal limits.  Prostate unremarkable.

No free air or fluid.  No adenopathy.

No acute osseous abnormality. No worrisome lytic or blastic osseous
lesions.
IMPRESSION: 1. Findings consistent with acute sigmoid diverticulitis. No
evidence for perforation or other complication.
2. No other acute intra-abdominal or pelvic abnormality.

## 2017-01-20 DIAGNOSIS — M7520 Bicipital tendinitis, unspecified shoulder: Secondary | ICD-10-CM | POA: Insufficient documentation

## 2017-01-20 DIAGNOSIS — M5412 Radiculopathy, cervical region: Secondary | ICD-10-CM | POA: Insufficient documentation

## 2017-09-29 ENCOUNTER — Other Ambulatory Visit: Payer: Self-pay | Admitting: Specialist

## 2017-10-05 ENCOUNTER — Other Ambulatory Visit: Payer: Self-pay

## 2017-10-05 ENCOUNTER — Inpatient Hospital Stay: Admission: RE | Admit: 2017-10-05 | Payer: Self-pay | Source: Ambulatory Visit

## 2017-10-07 ENCOUNTER — Other Ambulatory Visit: Payer: Self-pay

## 2017-10-07 ENCOUNTER — Encounter
Admission: RE | Admit: 2017-10-07 | Discharge: 2017-10-07 | Disposition: A | Discharge status: Home / Self Care | Payer: BLUE CROSS/BLUE SHIELD | Source: Ambulatory Visit | Attending: Specialist | Admitting: Specialist

## 2017-10-07 DIAGNOSIS — M7522 Bicipital tendinitis, left shoulder: Secondary | ICD-10-CM | POA: Diagnosis not present

## 2017-10-07 DIAGNOSIS — Z01818 Encounter for other preprocedural examination: Secondary | ICD-10-CM | POA: Diagnosis not present

## 2017-10-07 DIAGNOSIS — Z983 Post therapeutic collapse of lung status: Secondary | ICD-10-CM

## 2017-10-07 DIAGNOSIS — M7542 Impingement syndrome of left shoulder: Secondary | ICD-10-CM | POA: Insufficient documentation

## 2017-10-07 HISTORY — DX: Essential (primary) hypertension: I10

## 2017-10-07 HISTORY — DX: Angina pectoris, unspecified: I20.9

## 2017-10-07 HISTORY — DX: Pneumothorax, unspecified: J93.9

## 2017-10-07 HISTORY — DX: Sleep apnea, unspecified: G47.30

## 2017-10-07 HISTORY — DX: Acute myocardial infarction, unspecified: I21.9

## 2017-10-07 HISTORY — DX: Cardiac arrhythmia, unspecified: I49.9

## 2017-10-07 NOTE — Patient Instructions (Signed)
Your procedure is scheduled on: Wed. 12/12 Report to Day Surgery. To find out your arrival time please call [phone] between 1PM - 3PM on Tues 12/11.  Remember: Instructions that are not followed completely may result in serious medical risk, up to and including death, or upon the discretion of your surgeon and anesthesiologist your surgery may need to be rescheduled.     _X__ 1. Do not eat food after midnight the night before your procedure.                 No gum chewing or hard candies. You may drink clear liquids up to 2 hours                 before you are scheduled to arrive for your surgery- DO not drink clear                 liquids within 2 hours of the start of your surgery.                 Clear Liquids include:  water, apple juice without pulp, clear carbohydrate                 drink such as Clearfast of Gartorade, Black Coffee or Tea (Do not add                 anything to coffee or tea).     _X__ 2.  No Alcohol for 24 hours before or after surgery.   ___ 3.  Do Not Smoke or use e-cigarettes For 24 Hours Prior to Your Surgery.                 Do not use any chewable tobacco products for at least 6 hours prior to                 surgery.  ____  4.  Bring all medications with you on the day of surgery if instructed.   _x___  5.  Notify your doctor if there is any change in your medical condition      (cold, fever, infections).     Do not wear jewelry, make-up, hairpins, clips or nail polish. Do not wear lotions, powders, or perfumes. You may wear deodorant. Do not shave 48 hours prior to surgery. Men may shave face and neck. Do not bring valuables to the hospital.    [facility] belongings or valuables.  Contacts, dentures or bridgework may not be worn into surgery. Leave your suitcase in the car. After surgery it may be brought to your room. For patients admitted to the hospital, discharge time is determined by  your treatment team.   Patients discharged the day of surgery will not be allowed to drive home.   Please read over the following fact sheets that you were given:    __x__ Take these medicines the morning of surgery with A SIP OF WATER:    1. amLODipine (NORVASC) 5 MG tablet  2. DULoxetine (CYMBALTA) 60 MG capsule if needed  3. fenofibrate micronized (LOFIBRA) 134 MG capsule  4.methocarbamol (ROBAXIN) 500 MG tablet  5.metoprolol succinate (TOPROL-XL) 25 MG 24 hr tablet  6.  ____ Fleet Enema (as directed)   _x___ Use CHG Soap as directed  _x___ Use inhalers on the day of surgery UTIBRON NEOHALER 27.5-15.6 MCG CAPS,albuterol (PROVENTIL) (2.5 MG/3ML) 0.083% nebulizer solution  ____ Stop metformin 2 days prior to surgery  ____ Take 1/2 of usual insulin dose the night before surgery. No insulin the morning          of surgery.   __x__ Stop Plavix 5 days before and resume 2 days after    /aspirin on today  __x__ Stop Anti-inflammatories today meloxicam (MOBIC) 15 MG tablet  Tylenol OK   __x__ Stop supplements until after surgery.  [place] Mushroom,Keto Cleanse,Keto Nutrition Absolute   __x__ Bring C-Pap to the hospital.

## 2017-10-11 MED ORDER — CLINDAMYCIN PHOSPHATE 600 MG/50ML IV SOLN
600.0000 mg | INTRAVENOUS | Status: AC
  Administered 2017-10-12: 600 mg via INTRAVENOUS

## 2017-10-11 MED ORDER — CEFAZOLIN SODIUM-DEXTROSE 2-4 GM/100ML-% IV SOLN
2.0000 g | INTRAVENOUS | Status: AC
  Administered 2017-10-12: 2 g via INTRAVENOUS

## 2017-10-12 ENCOUNTER — Ambulatory Visit
Admission: RE | Admit: 2017-10-12 | Discharge: 2017-10-12 | Disposition: A | Discharge status: Home / Self Care | Payer: BLUE CROSS/BLUE SHIELD | Source: Ambulatory Visit | Attending: Specialist | Admitting: Specialist

## 2017-10-12 ENCOUNTER — Encounter
Admission: RE | Disposition: A | Discharge status: Home / Self Care | Payer: Self-pay | Source: Ambulatory Visit | Attending: Specialist

## 2017-10-12 ENCOUNTER — Ambulatory Visit: Payer: BLUE CROSS/BLUE SHIELD | Admitting: Anesthesiology

## 2017-10-12 DIAGNOSIS — M7522 Bicipital tendinitis, left shoulder: Secondary | ICD-10-CM | POA: Insufficient documentation

## 2017-10-12 DIAGNOSIS — M24112 Other articular cartilage disorders, left shoulder: Secondary | ICD-10-CM | POA: Insufficient documentation

## 2017-10-12 DIAGNOSIS — M7542 Impingement syndrome of left shoulder: Secondary | ICD-10-CM | POA: Insufficient documentation

## 2017-10-12 DIAGNOSIS — Z79899 Other long term (current) drug therapy: Secondary | ICD-10-CM | POA: Insufficient documentation

## 2017-10-12 DIAGNOSIS — I252 Old myocardial infarction: Secondary | ICD-10-CM | POA: Diagnosis not present

## 2017-10-12 DIAGNOSIS — M94212 Chondromalacia, left shoulder: Secondary | ICD-10-CM | POA: Diagnosis not present

## 2017-10-12 DIAGNOSIS — Z7902 Long term (current) use of antithrombotics/antiplatelets: Secondary | ICD-10-CM | POA: Insufficient documentation

## 2017-10-12 DIAGNOSIS — M7552 Bursitis of left shoulder: Secondary | ICD-10-CM | POA: Diagnosis not present

## 2017-10-12 DIAGNOSIS — J449 Chronic obstructive pulmonary disease, unspecified: Secondary | ICD-10-CM | POA: Insufficient documentation

## 2017-10-12 DIAGNOSIS — Z791 Long term (current) use of non-steroidal anti-inflammatories (NSAID): Secondary | ICD-10-CM | POA: Diagnosis not present

## 2017-10-12 DIAGNOSIS — Z7982 Long term (current) use of aspirin: Secondary | ICD-10-CM | POA: Diagnosis not present

## 2017-10-12 DIAGNOSIS — I10 Essential (primary) hypertension: Secondary | ICD-10-CM | POA: Diagnosis not present

## 2017-10-12 DIAGNOSIS — Z87891 Personal history of nicotine dependence: Secondary | ICD-10-CM | POA: Insufficient documentation

## 2017-10-12 DIAGNOSIS — G4730 Sleep apnea, unspecified: Secondary | ICD-10-CM | POA: Insufficient documentation

## 2017-10-12 HISTORY — DX: Failed or difficult intubation, initial encounter: T88.4XXA

## 2017-10-12 HISTORY — PX: SHOULDER ARTHROSCOPY WITH OPEN ROTATOR CUFF REPAIR: SHX6092

## 2017-10-12 SURGERY — ARTHROSCOPY, SHOULDER WITH REPAIR, ROTATOR CUFF, OPEN
Anesthesia: General | Laterality: Left

## 2017-10-12 MED ORDER — EPINEPHRINE 30 MG/30ML IJ SOLN
INTRAMUSCULAR | Status: AC
  Filled 2017-10-12: qty 1

## 2017-10-12 MED ORDER — LIDOCAINE HCL (PF) 2 % IJ SOLN
INTRAMUSCULAR | Status: AC
  Filled 2017-10-12: qty 10

## 2017-10-12 MED ORDER — LIDOCAINE HCL (CARDIAC) 20 MG/ML IV SOLN
INTRAVENOUS | Status: DC | PRN
  Administered 2017-10-12: 80 mg via INTRAVENOUS

## 2017-10-12 MED ORDER — MIDAZOLAM HCL 2 MG/2ML IJ SOLN
INTRAMUSCULAR | Status: DC | PRN
  Administered 2017-10-12: 2 mg via INTRAVENOUS

## 2017-10-12 MED ORDER — CELECOXIB 200 MG PO CAPS
200.0000 mg | ORAL_CAPSULE | ORAL | Status: AC
  Administered 2017-10-12: 200 mg via ORAL

## 2017-10-12 MED ORDER — MELOXICAM 15 MG PO TABS: 15.0000 mg | ORAL_TABLET | Freq: Every day | ORAL | 3 refills | Status: DC

## 2017-10-12 MED ORDER — EPHEDRINE SULFATE 50 MG/ML IJ SOLN
INTRAMUSCULAR | Status: DC | PRN
  Administered 2017-10-12 (×2): 10 mg via INTRAVENOUS

## 2017-10-12 MED ORDER — CEFAZOLIN SODIUM-DEXTROSE 2-4 GM/100ML-% IV SOLN
INTRAVENOUS | Status: AC
  Filled 2017-10-12: qty 100

## 2017-10-12 MED ORDER — BUPIVACAINE-EPINEPHRINE (PF) 0.5% -1:200000 IJ SOLN
INTRAMUSCULAR | Status: DC | PRN
  Administered 2017-10-12: 20 mL via PERINEURAL

## 2017-10-12 MED ORDER — CHLORHEXIDINE GLUCONATE CLOTH 2 % EX PADS: 6.0000 | MEDICATED_PAD | Freq: Once | CUTANEOUS | Status: DC

## 2017-10-12 MED ORDER — OXYCODONE HCL 5 MG PO TABS
ORAL_TABLET | ORAL | Status: AC
  Filled 2017-10-12: qty 1

## 2017-10-12 MED ORDER — PROPOFOL 10 MG/ML IV BOLUS
INTRAVENOUS | Status: DC | PRN
  Administered 2017-10-12: 200 mg via INTRAVENOUS

## 2017-10-12 MED ORDER — FAMOTIDINE 20 MG PO TABS
ORAL_TABLET | ORAL | Status: AC
  Administered 2017-10-12: 20 mg via ORAL
  Filled 2017-10-12: qty 1

## 2017-10-12 MED ORDER — OXYCODONE HCL 5 MG/5ML PO SOLN: 5.0000 mg | Freq: Once | ORAL | Status: AC | PRN

## 2017-10-12 MED ORDER — ACETAMINOPHEN 10 MG/ML IV SOLN
INTRAVENOUS | Status: AC
  Filled 2017-10-12: qty 100

## 2017-10-12 MED ORDER — SUGAMMADEX SODIUM 500 MG/5ML IV SOLN
INTRAVENOUS | Status: AC
  Filled 2017-10-12: qty 5

## 2017-10-12 MED ORDER — OXYCODONE HCL 5 MG PO TABS
5.0000 mg | ORAL_TABLET | Freq: Once | ORAL | Status: AC | PRN
  Administered 2017-10-12: 5 mg via ORAL

## 2017-10-12 MED ORDER — ONDANSETRON HCL 4 MG/2ML IJ SOLN
INTRAMUSCULAR | Status: DC | PRN
  Administered 2017-10-12: 4 mg via INTRAVENOUS

## 2017-10-12 MED ORDER — SUCCINYLCHOLINE CHLORIDE 20 MG/ML IJ SOLN
INTRAMUSCULAR | Status: AC
  Filled 2017-10-12: qty 1

## 2017-10-12 MED ORDER — FENTANYL CITRATE (PF) 100 MCG/2ML IJ SOLN: 25.0000 ug | INTRAMUSCULAR | 0 refills | Status: DC | PRN

## 2017-10-12 MED ORDER — LACTATED RINGERS IV SOLN
INTRAVENOUS | Status: DC | PRN
  Administered 2017-10-12: 07:00:00 via INTRAVENOUS

## 2017-10-12 MED ORDER — PHENYLEPHRINE HCL 10 MG/ML IJ SOLN
INTRAMUSCULAR | Status: AC
  Filled 2017-10-12: qty 1

## 2017-10-12 MED ORDER — FENTANYL CITRATE (PF) 100 MCG/2ML IJ SOLN
INTRAMUSCULAR | Status: AC
  Administered 2017-10-12: 25 ug via INTRAVENOUS
  Filled 2017-10-12: qty 2

## 2017-10-12 MED ORDER — GABAPENTIN 400 MG PO CAPS: 400.0000 mg | ORAL_CAPSULE | ORAL | Status: DC

## 2017-10-12 MED ORDER — BUPIVACAINE-EPINEPHRINE (PF) 0.25% -1:200000 IJ SOLN
INTRAMUSCULAR | Status: AC
  Filled 2017-10-12: qty 30

## 2017-10-12 MED ORDER — MIDAZOLAM HCL 2 MG/2ML IJ SOLN
INTRAMUSCULAR | Status: AC
  Filled 2017-10-12: qty 2

## 2017-10-12 MED ORDER — FENTANYL CITRATE (PF) 100 MCG/2ML IJ SOLN
INTRAMUSCULAR | Status: AC
  Filled 2017-10-12: qty 2

## 2017-10-12 MED ORDER — CELECOXIB 200 MG PO CAPS
ORAL_CAPSULE | ORAL | Status: AC
  Administered 2017-10-12: 200 mg via ORAL
  Filled 2017-10-12: qty 1

## 2017-10-12 MED ORDER — ONDANSETRON HCL 4 MG/2ML IJ SOLN
INTRAMUSCULAR | Status: AC
  Filled 2017-10-12: qty 2

## 2017-10-12 MED ORDER — FAMOTIDINE 20 MG PO TABS
20.0000 mg | ORAL_TABLET | Freq: Once | ORAL | Status: AC
  Administered 2017-10-12: 20 mg via ORAL

## 2017-10-12 MED ORDER — FENTANYL CITRATE (PF) 100 MCG/2ML IJ SOLN
INTRAMUSCULAR | Status: DC | PRN
  Administered 2017-10-12 (×2): 50 ug via INTRAVENOUS
  Administered 2017-10-12: 150 ug via INTRAVENOUS
  Administered 2017-10-12: 50 ug via INTRAVENOUS

## 2017-10-12 MED ORDER — OXYCODONE HCL 5 MG PO TABS: 5.0000 mg | ORAL_TABLET | Freq: Once | ORAL | 0 refills | Status: DC | PRN

## 2017-10-12 MED ORDER — BUPIVACAINE-EPINEPHRINE (PF) 0.5% -1:200000 IJ SOLN
INTRAMUSCULAR | Status: DC | PRN
  Administered 2017-10-12: 30 mL

## 2017-10-12 MED ORDER — CLINDAMYCIN PHOSPHATE 600 MG/50ML IV SOLN
INTRAVENOUS | Status: AC
  Filled 2017-10-12: qty 50

## 2017-10-12 MED ORDER — GABAPENTIN 400 MG PO CAPS
ORAL_CAPSULE | ORAL | Status: AC
  Filled 2017-10-12: qty 1

## 2017-10-12 MED ORDER — NEOMYCIN-POLYMYXIN B GU 40-200000 IR SOLN
Status: AC
  Filled 2017-10-12: qty 1

## 2017-10-12 MED ORDER — GABAPENTIN 400 MG PO CAPS: 400.0000 mg | ORAL_CAPSULE | Freq: Two times a day (BID) | ORAL | 3 refills | Status: DC

## 2017-10-12 MED ORDER — FENTANYL CITRATE (PF) 250 MCG/5ML IJ SOLN
INTRAMUSCULAR | Status: AC
  Filled 2017-10-12: qty 5

## 2017-10-12 MED ORDER — LIDOCAINE HCL 4 % MT SOLN
OROMUCOSAL | Status: DC | PRN
  Administered 2017-10-12: 4 mL via TOPICAL

## 2017-10-12 MED ORDER — EPHEDRINE SULFATE 50 MG/ML IJ SOLN
INTRAMUSCULAR | Status: AC
  Filled 2017-10-12: qty 1

## 2017-10-12 MED ORDER — BUPIVACAINE-EPINEPHRINE (PF) 0.5% -1:200000 IJ SOLN
INTRAMUSCULAR | Status: AC
  Filled 2017-10-12: qty 30

## 2017-10-12 MED ORDER — FENTANYL CITRATE (PF) 100 MCG/2ML IJ SOLN
25.0000 ug | INTRAMUSCULAR | Status: DC | PRN
  Administered 2017-10-12 (×5): 25 ug via INTRAVENOUS

## 2017-10-12 MED ORDER — SEVOFLURANE IN SOLN
RESPIRATORY_TRACT | Status: AC
  Filled 2017-10-12: qty 250

## 2017-10-12 MED ORDER — MORPHINE SULFATE (PF) 4 MG/ML IV SOLN
INTRAVENOUS | Status: AC
  Filled 2017-10-12: qty 1

## 2017-10-12 MED ORDER — HYDROCODONE-ACETAMINOPHEN 7.5-325 MG PO TABS: 1.0000 | ORAL_TABLET | Freq: Four times a day (QID) | ORAL | 0 refills | Status: DC | PRN

## 2017-10-12 MED ORDER — PROPOFOL 10 MG/ML IV BOLUS
INTRAVENOUS | Status: AC
  Filled 2017-10-12: qty 20

## 2017-10-12 MED ORDER — ACETAMINOPHEN 10 MG/ML IV SOLN
INTRAVENOUS | Status: DC | PRN
  Administered 2017-10-12: 1000 mg via INTRAVENOUS

## 2017-10-12 MED ORDER — SUGAMMADEX SODIUM 500 MG/5ML IV SOLN
INTRAVENOUS | Status: DC | PRN
  Administered 2017-10-12: 260 mg via INTRAVENOUS

## 2017-10-12 MED ORDER — ROCURONIUM BROMIDE 100 MG/10ML IV SOLN
INTRAVENOUS | Status: DC | PRN
  Administered 2017-10-12: 40 mg via INTRAVENOUS
  Administered 2017-10-12: 10 mg via INTRAVENOUS

## 2017-10-12 MED ORDER — ROCURONIUM BROMIDE 50 MG/5ML IV SOLN
INTRAVENOUS | Status: AC
  Filled 2017-10-12: qty 1

## 2017-10-12 MED ORDER — MORPHINE SULFATE (PF) 4 MG/ML IV SOLN
INTRAVENOUS | Status: DC | PRN
  Administered 2017-10-12: 4 mg via INTRAVENOUS

## 2017-10-12 MED ORDER — HYDROMORPHONE HCL 1 MG/ML IJ SOLN
0.2500 mg | INTRAMUSCULAR | Status: DC | PRN
Start: 2017-10-12 — End: 2017-10-12

## 2017-10-12 MED ORDER — SUCCINYLCHOLINE CHLORIDE 20 MG/ML IJ SOLN
INTRAMUSCULAR | Status: DC | PRN
  Administered 2017-10-12: 140 mg via INTRAVENOUS

## 2017-10-12 SURGICAL SUPPLY — 50 items
ADAPTER IRRIG TUBE 2 SPIKE SOL (ADAPTER) ×6 IMPLANT
BLADE AGGRESSIVE PLUS 4.0 (BLADE) ×3 IMPLANT
BUR AGGRESSIVE+ 5.5 (BURR) IMPLANT
BUR BR 5.5 12 FLUTE (BURR) ×3 IMPLANT
BUR RADIUS 4.0X18.5 (BURR) IMPLANT
BUR RADIUS 5.5 (BURR) IMPLANT
CANNULA 5.75X7 CRYSTAL CLEAR (CANNULA) ×3 IMPLANT
CANNULA 8.5X75 THRED (CANNULA) ×3 IMPLANT
CANNULA PARTIAL THREAD 2X7 (CANNULA) ×3 IMPLANT
CHLORAPREP W/TINT 26ML (MISCELLANEOUS) ×6 IMPLANT
CONNECTOR PERFECT PASSER (CONNECTOR) IMPLANT
COVER MAYO STAND STRL (DRAPES) ×3 IMPLANT
DRAPE IMP U-DRAPE 54X76 (DRAPES) ×6 IMPLANT
DRAPE SHEET LG 3/4 BI-LAMINATE (DRAPES) ×3 IMPLANT
DRAPE STERI 35X30 U-POUCH (DRAPES) ×3 IMPLANT
GAUZE PETRO XEROFOAM 1X8 (MISCELLANEOUS) ×3 IMPLANT
GAUZE SPONGE 4X4 12PLY STRL (GAUZE/BANDAGES/DRESSINGS) ×6 IMPLANT
GLOVE SURG ORTHO 8.0 STRL STRW (GLOVE) ×6 IMPLANT
GOWN STRL REUS W/TWL LRG LVL4 (GOWN DISPOSABLE) ×6 IMPLANT
IV LACTATED RINGER IRRG 3000ML (IV SOLUTION) ×8
IV LR IRRIG 3000ML ARTHROMATIC (IV SOLUTION) ×4 IMPLANT
KIT RM TURNOVER STRD PROC AR (KITS) ×3 IMPLANT
KIT SHOULDER TRACTION (DRAPES) ×3 IMPLANT
MANIFOLD NEPTUNE II (INSTRUMENTS) ×3 IMPLANT
MAT BLUE FLOOR 46X72 FLO (MISCELLANEOUS) ×6 IMPLANT
NDL SAFETY ECLIPSE 18X1.5 (NEEDLE) ×2 IMPLANT
NEEDLE HYPO 18GX1.5 SHARP (NEEDLE) ×4
NEEDLE SPNL 18GX3.5 QUINCKE PK (NEEDLE) ×6 IMPLANT
NS IRRIG 500ML POUR BTL (IV SOLUTION) ×3 IMPLANT
PACK ARTHROSCOPY SHOULDER (MISCELLANEOUS) ×3 IMPLANT
PASSER SUT CAPTURE FIRST (SUTURE) ×3 IMPLANT
SET TUBE SUCT SHAVER OUTFL 24K (TUBING) ×3 IMPLANT
SLING ULTRA II LG (MISCELLANEOUS) ×3 IMPLANT
SOL PREP PVP 2OZ (MISCELLANEOUS) ×3
SOLUTION PREP PVP 2OZ (MISCELLANEOUS) ×1 IMPLANT
SUT ETHILON 3 0 FSLX (SUTURE) ×3 IMPLANT
SUT PDS PLUS 0 (SUTURE) ×2
SUT PDS PLUS AB 0 CT-2 (SUTURE) ×1 IMPLANT
SUT PERFECTPASSER WHITE CART (SUTURE) ×3 IMPLANT
SUT VIC AB 2-0 CT2 27 (SUTURE) ×3 IMPLANT
SUT VICRYL 3-0 27IN (SUTURE) ×3 IMPLANT
SUTURE MAGNUM WIRE 2X48 BLK (SUTURE) ×3 IMPLANT
SYR 20CC LL (SYRINGE) ×3 IMPLANT
SYR 30ML LL (SYRINGE) ×3 IMPLANT
SYR 50ML LL SCALE MARK (SYRINGE) ×3 IMPLANT
TUBING ARTHRO INFLOW-ONLY STRL (TUBING) ×3 IMPLANT
TUBING CONNECTING 10 (TUBING) ×2 IMPLANT
TUBING CONNECTING 10' (TUBING) ×1
WAND COBLATION FLOW 50 (SURGICAL WAND) IMPLANT
WAND HAND CNTRL MULTIVAC 90 (MISCELLANEOUS) ×3 IMPLANT

## 2017-10-12 NOTE — Discharge Instructions (Signed)

## 2017-10-12 NOTE — Anesthesia Procedure Notes (Signed)
Procedure Name: Intubation Performed by: Lance Muss, CRNA Pre-anesthesia Checklist: Patient identified, Patient being monitored, Timeout performed, Emergency Drugs available and Suction available Patient Re-evaluated:Patient Re-evaluated prior to induction Oxygen Delivery Method: Circle system utilized Preoxygenation: Pre-oxygenation with 100% oxygen Induction Type: IV induction Ventilation: Mask ventilation without difficulty and Oral airway inserted - appropriate to patient size Laryngoscope Size: McGraph and 4 Grade View: Grade I Tube type: Oral Tube size: 7.5 mm Number of attempts: 1 Airway Equipment and Method: Stylet,  Video-laryngoscopy and LTA kit utilized Placement Confirmation: ETT inserted through vocal cords under direct vision,  positive ETCO2 and breath sounds checked- equal and bilateral Secured at: 23 cm Tube secured with: Tape Dental Injury: Teeth and Oropharynx as per pre-operative assessment  Difficulty Due To: Difficult Airway- due to anterior larynx Future Recommendations: Recommend- induction with short-acting agent, and alternative techniques readily available

## 2017-10-12 NOTE — Transfer of Care (Signed)
Immediate Anesthesia Transfer of Care Note  Patient: Scott Villanueva  Procedure(s) Performed: SHOULDER ARTHROSCOPY WITH OPEN ROTATOR CUFF REPAIR (Left )  Patient Location: PACU  Anesthesia Type:General  Level of Consciousness: sedated and responds to stimulation  Airway & Oxygen Therapy: Patient Spontanous Breathing and Patient connected to face mask oxygen  Post-op Assessment: Report given to RN and Post -op Vital signs reviewed and stable  Post vital signs: Reviewed and stable  Last Vitals:  Vitals:   10/12/17 0618 10/12/17 0959  BP: (!) 144/81 125/68  Pulse: 79 78  Resp: 20 (!) 21  Temp: 37.2 C 36.8 C  SpO2: 98% 95%    Last Pain:  Vitals:   10/12/17 0618  TempSrc: Tympanic  PainSc: 4       Patients Stated Pain Goal: 2 (10/12/17 0618)  Complications: No apparent anesthesia complications

## 2017-10-12 NOTE — Anesthesia Preprocedure Evaluation (Signed)
Anesthesia Evaluation  Patient identified by MRN, date of birth, ID band Patient awake    Reviewed: Allergy & Precautions, H&P , NPO status , Patient's Chart, lab work & pertinent test results  History of Anesthesia Complications Negative for: history of anesthetic complications  Airway Mallampati: III  TM Distance: <3 FB Neck ROM: limited    Dental  (+) Chipped, Missing   Pulmonary neg shortness of breath, sleep apnea , COPD (lungs of an 56 year old per patient), former smoker,           Cardiovascular Exercise Tolerance: Good hypertension, (-) angina+ Past MI  (-) DOE + dysrhythmias      Neuro/Psych negative neurological ROS  negative psych ROS   GI/Hepatic negative GI ROS, Neg liver ROS, neg GERD  ,  Endo/Other  negative endocrine ROS  Renal/GU      Musculoskeletal   Abdominal   Peds  Hematology negative hematology ROS (+)   Anesthesia Other Findings Past Medical History: No date: Anginal pain (HCC) No date: Dysrhythmia No date: Hypertension No date: Myocardial infarction [facility](HCC)     Comment:   Feb.2008 2006: Pneumothorax No date: Sleep apnea     Comment:  CPAP  Past Surgical History: No date: CARDIAC CATHETERIZATION     Comment:  with stents 2006: PLEURAL SCARIFICATION; Left No date: TONSILLECTOMY  BMI    Body Mass Index:  40.89 kg/m      Reproductive/Obstetrics negative OB ROS                             Anesthesia Physical Anesthesia Plan  ASA: III  Anesthesia Plan: General ETT   Post-op Pain Management:    Induction: Intravenous  PONV Risk Score and Plan: 2 and Ondansetron, Dexamethasone and Midazolam  Airway Management Planned: Oral ETT and Video Laryngoscope Planned  Additional Equipment:   Intra-op Plan:   Post-operative Plan: Extubation in OR  Informed Consent: I have reviewed the patients History and Physical, chart, labs and discussed the  procedure including the risks, benefits and alternatives for the proposed anesthesia with the patient or authorized representative who has indicated his/her understanding and acceptance.   Dental Advisory Given  Plan Discussed with: Anesthesiologist, CRNA and Surgeon  Anesthesia Plan Comments: (Patient consented for risks of anesthesia including but not limited to:  - adverse reactions to medications - damage to teeth, lips or other oral mucosa - sore throat or hoarseness - Damage to heart, brain, lungs or loss of life  Patient voiced understanding.)        Anesthesia Quick Evaluation

## 2017-10-12 NOTE — Anesthesia Postprocedure Evaluation (Signed)
Anesthesia Post Note  Patient: Scott Villanueva  Procedure(s) Performed: SHOULDER ARTHROSCOPY WITH OPEN ROTATOR CUFF REPAIR (Left )  Patient location during evaluation: PACU Anesthesia Type: General Level of consciousness: awake and alert Pain management: pain level controlled Vital Signs Assessment: post-procedure vital signs reviewed and stable Respiratory status: spontaneous breathing, nonlabored ventilation, respiratory function stable and patient connected to nasal cannula oxygen Cardiovascular status: blood pressure returned to baseline and stable Postop Assessment: no apparent nausea or vomiting Anesthetic complications: no     Last Vitals:  Vitals:   10/12/17 1112 10/12/17 1127  BP: 131/64 127/70  Pulse:    Resp:    Temp: (!) 36.3 C   SpO2: 95% 98%    Last Pain:  Vitals:   10/12/17 1112  TempSrc:   PainSc: 3                  Cleda MccreedyJoseph K Piscitello

## 2017-10-12 NOTE — Anesthesia Post-op Follow-up Note (Signed)
Anesthesia QCDR form completed.        

## 2017-10-12 NOTE — Op Note (Signed)
10/12/2017  9:52 AM  PATIENT:  Scott Villanueva    PRE-OPERATIVE DIAGNOSIS:  M75.22 Bicipital Tendintis, Left Shoulder  M75.42 Impingement syndrome of left shoulder  POST-OPERATIVE DIAGNOSIS:  Same  PROCEDURE:  SHOULDER ARTHROSCOPY WITH OPEN ROTATOR CUFF REPAIR  SURGEON:  Valinda HoarMILLER,Joclynn Lumb E, MD  ANESTHESIA:   General  PREOPERATIVE INDICATIONS:  Scott RaterWilliam H Frede is a  56 y.o. male with a diagnosis of M75.22 Bicipital Tendintis, Left Shoulder  M75.42 Impingement syndrome of left shoulder who failed conservative measures and elected for surgical management.    The risks benefits and alternatives were discussed with the patient preoperatively including but not limited to the risks of infection, bleeding, nerve injury, cardiopulmonary complications, the need for revision surgery, among others, and the patient was willing to proceed.  EBL: minimal   OPERATIVE FINDINGS: Grade IV chondromalacia over a large portion of the humeral head.  Mild chondromalacia of the glenoid.  Severe tearing of the glenoid labrum with fragments caught in the glenohumeral articulation severe fraying of the biceps tendon mild fraying of the undersurface of the rotator cuff supraspinatus and infraspinatus no frank tear intact rotator cuff from on the bursal side.  Very narrow acromial space and severe spurring of the distal clavicle moderate bursitis in the subacromial space.  OPERATIVE PROCEDURE: The patient was brought to the operating room where satisfactory general endotracheal and interscalene anesthesia were accomplished.The patient was turned into the lateral decubitus position and the shoulder was prepped and draped in a sterile fashion. Arthroscopy was carried out from a posterior portal with accessory portals laterally and anteriorly. The  joint was examined first. The above findings were encountered. The motorized shaver was introduced anteriorly and the undersurface of the rotator cuff probed and lightly  debrided. The biceps tendon was released due to severe fraying. The labrum was trimmed up with the ArthroCare wand. The arthroscope was redirected into the subacromial space. There was bursitis which was resected with the motorized shaver and ArthroCare wand. The large bur was introduced from a posterior portal and the anterior acromion was debrided. The undersurface of the clavicle was debrided with the bur which was then reintroduced from an anterior portal and the remaining distal clavicle completely excised.   Final debridement was carried out with the motorized shaver and the ArthroCare wand. The joint was flushed and the stab wounds and closed with 3-0 nylon suture. 1/2% Marcaine with morphine was injected into the shoulder. Sponge and needle counts were correct.   The dry sterile dressing and was applied along with a  padded sling. Patient was awakened and taken recovery in good condition.  Valinda HoarHoward E Mayce Noyes, MD

## 2017-10-12 NOTE — H&P (Signed)
THE PATIENT WAS SEEN PRIOR TO SURGERY TODAY.  HISTORY, ALLERGIES, HOME MEDICATIONS AND OPERATIVE PROCEDURE WERE REVIEWED. RISKS AND BENEFITS OF SURGERY DISCUSSED WITH PATIENT AGAIN.  NO CHANGES FROM INITIAL HISTORY AND PHYSICAL NOTED.    

## 2017-10-13 ENCOUNTER — Encounter: Payer: Self-pay | Admitting: Specialist

## 2017-12-15 ENCOUNTER — Ambulatory Visit: Payer: Self-pay | Admitting: Surgery

## 2017-12-15 NOTE — H&P (Signed)
History of Present Illness Scott Villanueva(Scott Chandley K. Aoki Wedemeyer MD; 12/15/2017 1:56 PM) The patient is a 57 year old male who presents with skin lesions. Referred by Dr. Jeanmarie Hubertob Ehinger for two right chest wall skin lesions  This is a 57 year old male in good health who presents with a 4 week history of 2 skin lesions that have appeared on his right chest wall. Initially, there was concern for staph infection so he was treated with doxycycline 10 days as well as mupirocin ointment. However the lesions continued progress. The upper lesion measures about 2.5 cm across and is open with some necrosis of the underlying tissue. There is a second lesion that is inferior more medial on the right chest wall near the edge of the pectoralis. This has begun to ulcerate within the last couple of days. There is some itching involved with both lesions but no significant pain. The patient has a history of basal cell carcinoma on his left upper extremity. He has long history of sun exposure. He presents now to discuss excision for biopsy and diagnosis.   Past Surgical History (April Staton, CMA; 12/15/2017 1:32 PM) Shoulder Surgery Left.  Allergies (April Staton, CMA; 12/15/2017 1:33 PM) No Known Drug Allergies [12/15/2017]:  Medication History (April Staton, CMA; 12/15/2017 1:34 PM) Advair Diskus (100-50MCG/DOSE Aero Pow Br Act, Inhalation) Active. Medications Reconciled  Social History ([place]; 12/15/2017 1:32 PM) Alcohol use Occasional alcohol use. Caffeine use Coffee. No drug use Tobacco use Never smoker.  Family History ([place]; 12/15/2017 1:32 PM) First Degree Relatives No pertinent family history  Other Problems (April Staton, CMA; 12/15/2017 1:32 PM) Asthma Cancer     Review of Systems (April Staton CMA; 12/15/2017 1:32 PM) General Not Present- Appetite Loss, Chills, Fatigue, Fever, Night Sweats, Weight Gain and Weight Loss. Skin Present- Ulcer. Not Present- Change in  Wart/Mole, Dryness, Hives, Jaundice, New Lesions, Non-Healing Wounds and Rash. HEENT Present- Wears glasses/contact lenses. Not Present- Earache, Hearing Loss, Hoarseness, Nose Bleed, Oral Ulcers, Ringing in the Ears, Seasonal Allergies, Sinus Pain, Sore Throat, Visual Disturbances and Yellow Eyes. Breast Not Present- Breast Mass, Breast Pain, Nipple Discharge and Skin Changes. Cardiovascular Not Present- Chest Pain, Difficulty Breathing Lying Down, Leg Cramps, Palpitations, Rapid Heart Rate, Shortness of Breath and Swelling of Extremities. Gastrointestinal Not Present- Abdominal Pain, Bloating, Bloody Stool, Change in Bowel Habits, Chronic diarrhea, Constipation, Difficulty Swallowing, Excessive gas, Gets full quickly at meals, Hemorrhoids, Indigestion, Nausea, Rectal Pain and Vomiting. Male Genitourinary Not Present- Blood in Urine, Change in Urinary Stream, Frequency, Impotence, Nocturia, Painful Urination, Urgency and Urine Leakage. Musculoskeletal Not Present- Back Pain, Joint Pain, Joint Stiffness, Muscle Pain, Muscle Weakness and Swelling of Extremities. Neurological Not Present- Decreased Memory, Fainting, Headaches, Numbness, Seizures, Tingling, Tremor, Trouble walking and Weakness. Psychiatric Not Present- Anxiety, Bipolar, Change in Sleep Pattern, Depression, Fearful and Frequent crying. Endocrine Not Present- Cold Intolerance, Excessive Hunger, Hair Changes, Heat Intolerance, Hot flashes and New Diabetes. Hematology Not Present- Blood Thinners, Easy Bruising, Excessive bleeding, Gland problems, HIV and Persistent Infections.  Vitals (April Staton CMA; 12/15/2017 1:34 PM) 12/15/2017 1:34 PM Weight: 204.5 lb Height: 68in Body Surface Area: 2.06 m Body Mass Index: 31.09 kg/m  Temp.: 97.21F(Oral)  Pulse: 129 (Regular)  BP: 150/96 (Sitting, Left Arm, Standard)      Physical Exam Molli Hazard(Kailey Esquilin K. Haven Pylant MD; 12/15/2017 1:58 PM)  The physical exam findings are as  follows: Note:WDWN in NAD Eyes: Pupils equal, round; sclera anicteric HENT: Oral mucosa moist; good dentition Neck: No masses palpated,  no thyromegaly Chest - right anterior chest wall - above nipple 2.5 cm open ulcerated lesion - depth of about 1 cm with some granulation tissue on sides. Minimal clear drainage. No surrounding erythema Right anterior chest wall - inferior and medial 2 cm skin lesion with 0.8 cm open ulcer and some clear drainage. Slight eschar around open wound. no erythema. Nontender Lungs: CTA bilaterally; normal respiratory effort CV: Regular rate and rhythm; no murmurs; extremities well-perfused with no edema Abd: +bowel sounds, soft, non-tender, no palpable organomegaly; no palpable hernias Skin: Warm, dry; no sign of jaundice Psychiatric - alert and oriented x 4; calm mood and affect    Assessment & Plan Molli Hazard K. Sarika Baldini MD; 12/15/2017 1:54 PM)  SKIN ULCERATION, WITH FAT LAYER EXPOSED (Z61.096) Impression: Right chest wall 2.5 cm diameter Second lesion - medial right chest wall 2.0 cm; slight ulceration  Current Plans Schedule for Surgery - Wide excision of right chest wall skin lesions. The surgical procedure has been discussed with the patient. Potential risks, benefits, alternative treatments, and expected outcomes have been explained. All of the patient's questions at this time have been answered. The likelihood of reaching the patient's treatment goal is good. The patient understand the proposed surgical procedure and wishes to proceed.  Scott Villanueva. Corliss Skains, MD, [facility]  General/ Trauma Surgery  12/15/2017 1:59 PM

## 2017-12-16 ENCOUNTER — Ambulatory Visit: Payer: Self-pay | Admitting: Surgery

## 2019-06-02 DIAGNOSIS — T380X5A Adverse effect of glucocorticoids and synthetic analogues, initial encounter: Secondary | ICD-10-CM | POA: Insufficient documentation

## 2019-11-02 ENCOUNTER — Encounter: Payer: Self-pay | Admitting: Internal Medicine

## 2019-12-11 DIAGNOSIS — Z96619 Presence of unspecified artificial shoulder joint: Secondary | ICD-10-CM | POA: Insufficient documentation

## 2019-12-11 DIAGNOSIS — M479 Spondylosis, unspecified: Secondary | ICD-10-CM | POA: Insufficient documentation

## 2019-12-11 DIAGNOSIS — E7800 Pure hypercholesterolemia, unspecified: Secondary | ICD-10-CM | POA: Insufficient documentation

## 2019-12-12 ENCOUNTER — Encounter: Payer: Self-pay | Admitting: Internal Medicine

## 2019-12-12 ENCOUNTER — Ambulatory Visit (INDEPENDENT_AMBULATORY_CARE_PROVIDER_SITE_OTHER): Payer: Medicare HMO | Admitting: Internal Medicine

## 2019-12-12 ENCOUNTER — Other Ambulatory Visit: Payer: Self-pay

## 2019-12-12 DIAGNOSIS — Z1159 Encounter for screening for other viral diseases: Secondary | ICD-10-CM | POA: Diagnosis not present

## 2019-12-12 DIAGNOSIS — G8929 Other chronic pain: Secondary | ICD-10-CM

## 2019-12-12 DIAGNOSIS — I2510 Atherosclerotic heart disease of native coronary artery without angina pectoris: Secondary | ICD-10-CM | POA: Insufficient documentation

## 2019-12-12 DIAGNOSIS — Z1329 Encounter for screening for other suspected endocrine disorder: Secondary | ICD-10-CM

## 2019-12-12 DIAGNOSIS — Z1389 Encounter for screening for other disorder: Secondary | ICD-10-CM

## 2019-12-12 DIAGNOSIS — Z1211 Encounter for screening for malignant neoplasm of colon: Secondary | ICD-10-CM

## 2019-12-12 DIAGNOSIS — E669 Obesity, unspecified: Secondary | ICD-10-CM

## 2019-12-12 DIAGNOSIS — Z13818 Encounter for screening for other digestive system disorders: Secondary | ICD-10-CM

## 2019-12-12 DIAGNOSIS — E559 Vitamin D deficiency, unspecified: Secondary | ICD-10-CM

## 2019-12-12 DIAGNOSIS — R739 Hyperglycemia, unspecified: Secondary | ICD-10-CM | POA: Diagnosis not present

## 2019-12-12 DIAGNOSIS — K921 Melena: Secondary | ICD-10-CM

## 2019-12-12 DIAGNOSIS — Z125 Encounter for screening for malignant neoplasm of prostate: Secondary | ICD-10-CM

## 2019-12-12 DIAGNOSIS — R69 Illness, unspecified: Secondary | ICD-10-CM | POA: Diagnosis not present

## 2019-12-12 DIAGNOSIS — I10 Essential (primary) hypertension: Secondary | ICD-10-CM

## 2019-12-12 DIAGNOSIS — Z113 Encounter for screening for infections with a predominantly sexual mode of transmission: Secondary | ICD-10-CM

## 2019-12-12 DIAGNOSIS — E785 Hyperlipidemia, unspecified: Secondary | ICD-10-CM | POA: Diagnosis not present

## 2019-12-12 DIAGNOSIS — R110 Nausea: Secondary | ICD-10-CM

## 2019-12-12 MED ORDER — METOPROLOL SUCCINATE ER 25 MG PO TB24: 25.0000 mg | ORAL_TABLET | Freq: Every day | ORAL | 3 refills | Status: DC

## 2019-12-12 MED ORDER — ATORVASTATIN CALCIUM 40 MG PO TABS: 40.0000 mg | ORAL_TABLET | Freq: Every day | ORAL | 3 refills | Status: DC

## 2019-12-12 MED ORDER — AMLODIPINE BESYLATE 5 MG PO TABS: 5.0000 mg | ORAL_TABLET | Freq: Every day | ORAL | 3 refills | Status: DC

## 2019-12-12 MED ORDER — GABAPENTIN 100 MG PO CAPS: 100.0000 mg | ORAL_CAPSULE | Freq: Three times a day (TID) | ORAL | 3 refills | Status: DC

## 2019-12-12 NOTE — Progress Notes (Addendum)
Virtual Visit via Video Note  I connected with Scott Villanueva  on 12/12/19 at 10:45 AM EST by a video enabled telemedicine application and verified that I am speaking with the correct person using two identifiers.  Location patient: home Location provider:work or home office Persons participating in the virtual visit: patient, provider, pts wife  I discussed the limitations of evaluation and management by telemedicine and the availability of in person appointments. The patient expressed understanding and agreed to proceed.   HPI: 1. HTN BP 135/90 on norvasc 5 mg and toprol 25 mg qd  2. He has been depressed and finances stressor currently not working neither is wife and living off his disability  3. Old PCP Dr. Brunetta Genera saw 10/2019 or 11/2019 for handicap sticker does not wish to return  4. C/o chronic nausea and blood in stool or black stools and CT imaging with hx of diverticulitis  -prev had colonoscopy in 48s with Dr. Silvio Pate with [facility] in [place] will refer back    ROS: See pertinent positives and negatives per HPI. General: obesity + Heent: nl hearing  CV: no chest pain  Lungs: no sob  GI: black stools +, chronic nausea  GU: no issues  MSK: no joint pain  Skin: skin normal  Neuro: no h/a  Psych: +depressed   Past Medical History:  Diagnosis Date  . Anginal pain ([place])   . CAD (coronary artery disease)    s/p stent Dr. Doylene Canard; in 74s stents  . Difficult intubation   . Diverticulitis   . Dysrhythmia   . HLD (hyperlipidemia)   . Hypertension   . Myocardial infarction ([place])     Feb.2008  . Obesity   . Pneumothorax 2006  . Sleep apnea    CPAP    Past Surgical History:  Procedure Laterality Date  . CARDIAC CATHETERIZATION     with stents  . PLEURAL SCARIFICATION Left 2006  . SHOULDER ARTHROSCOPY WITH OPEN ROTATOR CUFF REPAIR Left 10/12/2017   Procedure: SHOULDER ARTHROSCOPY WITH OPEN ROTATOR CUFF REPAIR;  Surgeon: Earnestine Leys, MD;  Location: [facility] ORS;  Service:  Orthopedics;  Laterality: Left;  Left shoulder arthroscopy, subaccromial decompression, distal clavical resection and biceps tenotomy  . TONSILLECTOMY      Family History  Problem Relation Age of Onset  . Depression Mother   . Anemia Mother   . Thyroid disease Mother   . Huntington's disease Mother   . CAD Father        s/p bypass   . Obesity Father   . Huntington's disease Sister   . Depression Sister   . Schizophrenia Brother   . 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  aspirin EC 81 MG tablet, Take 81 mg by mouth daily., Disp: , Rfl:  .  chlorzoxazone (PARAFON) 500 MG tablet, Take 500 mg by mouth daily as needed for muscle spasms., Disp: , Rfl:  .  Cholecalciferol (VITAMIN D3) 50 MCG (2000 UT) TABS, Take 6,000 tablets by mouth., Disp: , Rfl:  .  Omega-3 Fatty Acids (FISH OIL PO), Take by mouth., Disp: , Rfl:  .  amLODipine (NORVASC) 5 MG tablet, Take 1 tablet (5 mg total) by mouth daily., Disp: 90 tablet, Rfl: 3 .  atorvastatin (LIPITOR) 40 MG tablet, Take 1 tablet (40 mg total) by mouth daily at 6 PM., Disp: 90 tablet, Rfl: 3 .  b complex vitamins capsule, Take 1 capsule by mouth daily., Disp: , Rfl:  .  clopidogrel (PLAVIX) 75 MG tablet, Take 75 mg by mouth daily., Disp: , Rfl:  .  fenofibrate micronized (LOFIBRA) 134 MG capsule, Take 134 mg by mouth daily before breakfast., Disp: , Rfl:  .  methocarbamol (ROBAXIN) 500 MG tablet, Take 500 mg by mouth 2 (two) times daily as needed for pain., Disp: , Rfl: 0 .  metoprolol succinate (TOPROL-XL) 25 MG 24 hr tablet, Take 1 tablet (25 mg total) by mouth daily., Disp: 90 tablet, Rfl: 3 .  Multiple Vitamin (MULTIVITAMIN WITH MINERALS) TABS tablet, Take 1 tablet by mouth daily., Disp: , Rfl:   EXAM:  VITALS  per patient if applicable:  GENERAL: alert, oriented, appears well and in no acute distress  HEENT: atraumatic, conjunttiva clear, no obvious abnormalities on inspection of external nose and ears  NECK: normal movements of the head and neck  LUNGS: on inspection no signs of respiratory distress, breathing rate appears normal, no obvious gross SOB, gasping or wheezing  CV: no obvious cyanosis  MS: moves all visible extremities without noticeable abnormality  PSYCH/NEURO: pleasant and cooperative, no obvious depression or anxiety, speech and thought processing grossly intact  ASSESSMENT AND PLAN:  Discussed the following assessment and plan:  Essential hypertension - Plan: Comprehensive metabolic panel, Lipid panel, CBC with Differential/Platelet, metoprolol succinate (TOPROL-XL) 25 MG 24 hr tablet, amLODipine (NORVASC) 5 MG tablet BP with nex labs   Coronary artery disease involving native coronary artery of native heart without angina pectoris - Plan: atorvastatin (LIPITOR) 40 MG tablet F/u with Dr. Algie Coffer in [place] seeing since 05/2009  ROI sent  Reviewed notes seen 06/12/2019  On norvasc 5 mg qd Aspirin 81 mg qd  plavix  Fenofibrate 145 mg qd Gabapentin 400 mg bid  I caps bid  lorzone 750 mg qid  Losartan 50 mg qd  mobic 15 mg qd Robaxin 500 mg qid  toprol ER 25 mg qd  proair respiclick crestor 20 mg qd  utibron neohaler 27.5-15.6   Echo 09/29/17 normal LV function mild LVH and DD Hyperlipidemia, unspecified hyperlipidemia type - Plan: atorvastatin (LIPITOR) 40 MG tablet  Chronic nausea - Plan: Ambulatory referral to Gastroenterology D.r Anselm Jungling  Black stool - Plan: Ambulatory referral to Gastroenterology Encounter for screening colonoscopy - Plan: Ambulatory referral to Gastroenterology  Other chronic pain - Plan: DISCONTINUED: gabapentin (NEURONTIN) 100 MG capsule tid was on tramadol 50 mg q6 hrs #120 was getting mobic 15 mg qd, pafaron, robaxin ? From prior PCP  I  dont manage chronic pain so if pt needing this will need specialist care I.e pain clinic in the future   HM Flu shot had [facility] 07/2019 per pt  Tdap get with next labs and BP check  Consider shingrix vaccine  covid 19 vx pt unsure   Fasting labs sch with BP check and Tdap  Check PSA Colonoscopy referred [facility] Dr. Anselm Jungling Skin normal for now check in future  Smoker since age 89 y.o light per pt always < 1 ppd on and off 1 pk lasting 1-3 months x 45 years on and off  Consider testing for HD in future mother died of HD, 1/2 has gene as well  ROI sent to Dr. Algie Coffer cardiology per pt needs to resch stress test last seen 09/2019   -  we discussed possible serious and likely etiologies, options for evaluation and workup, limitations of telemedicine visit vs in person visit, treatment, treatment risks and precautions. Pt prefers to treat via telemedicine empirically rather then risking or undertaking an in person visit at this moment. Patient agrees to seek prompt in person care if worsening, new symptoms arise, or if is not improving with treatment.   I discussed the assessment and treatment plan with the patient. The patient was provided an opportunity to ask questions and all were answered. The patient agreed with the plan and demonstrated an understanding of the instructions.   The patient was advised to call back or seek an in-person evaluation if the symptoms worsen or if the condition fails to improve as anticipated.  Time spent 30 minutes  Bevelyn Buckles, MD

## 2019-12-18 ENCOUNTER — Ambulatory Visit: Payer: Medicare HMO | Admitting: Gastroenterology

## 2019-12-18 ENCOUNTER — Encounter: Payer: Self-pay | Admitting: Gastroenterology

## 2019-12-18 ENCOUNTER — Telehealth: Payer: Self-pay

## 2019-12-18 ENCOUNTER — Other Ambulatory Visit: Payer: Self-pay

## 2019-12-18 VITALS — BP 112/62 | HR 80 | Temp 98.5°F | Ht 70.0 in | Wt 317.2 lb

## 2019-12-18 DIAGNOSIS — K921 Melena: Secondary | ICD-10-CM | POA: Diagnosis not present

## 2019-12-18 DIAGNOSIS — R1013 Epigastric pain: Secondary | ICD-10-CM | POA: Diagnosis not present

## 2019-12-18 DIAGNOSIS — Z7902 Long term (current) use of antithrombotics/antiplatelets: Secondary | ICD-10-CM | POA: Diagnosis not present

## 2019-12-18 MED ORDER — NA SULFATE-K SULFATE-MG SULF 17.5-3.13-1.6 GM/177ML PO SOLN: 1.0000 | Freq: Once | ORAL | 0 refills | Status: AC

## 2019-12-18 NOTE — Progress Notes (Signed)
History of Present Illness: This is a 59 year old male referred by McLean-Scocuzza, French Ana * MD for the evaluation of blood in stool, black stools, chronic nausea.  Over the past few months he has noted frequent epigastric pain and nausea.  Occasionally symptoms are brought on by meals occasionally symptoms occur without meals.  He relates small amounts of bright red blood per rectum when straining with bowel movements for several years.  He has intermittent dark black stools for several years. He is vague about the quantity of his etoh use and states he is going to cut down on etoh use.  He relates a history of a colonoscopy performed around 2000 however we cannot locate procedure or path records in Epic or procedure records in [place].  He states polyps were found.  Denies weight loss, constipation, diarrhea, change in stool caliber, vomiting, dysphagia, reflux symptoms, chest pain.   Allergies  Allergen Reactions  . No Known Allergies    Outpatient Medications Prior to Visit  Medication Sig Dispense Refill  . albuterol (ACCUNEB) 0.63 MG/3ML nebulizer solution Take 1 ampule by nebulization every 6 (six) hours as needed for wheezing.    Marland Kitchen amLODipine (NORVASC) 5 MG tablet Take 1 tablet (5 mg total) by mouth daily. 90 tablet 3  . aspirin EC 81 MG tablet Take 81 mg by mouth daily.    Marland Kitchen atorvastatin (LIPITOR) 40 MG tablet Take 1 tablet (40 mg total) by mouth daily at 6 PM. 90 tablet 3  . chlorzoxazone (PARAFON) 500 MG tablet Take 500 mg by mouth daily as needed for muscle spasms.    . Cholecalciferol (VITAMIN D3) 50 MCG (2000 UT) TABS Take 6,000 tablets by mouth.    . clopidogrel (PLAVIX) 75 MG tablet Take 75 mg by mouth daily.    . fenofibrate micronized (LOFIBRA) 134 MG capsule Take 134 mg by mouth daily before breakfast.    . Ginkgo Biloba 40 MG TABS Take 1 tablet by mouth daily.    . methocarbamol (ROBAXIN) 500 MG tablet Take 500 mg by mouth 2 (two) times daily as needed for pain.  0  .  metoprolol succinate (TOPROL-XL) 25 MG 24 hr tablet Take 1 tablet (25 mg total) by mouth daily. 90 tablet 3  . Multiple Vitamin (MULTIVITAMIN WITH MINERALS) TABS tablet Take 1 tablet by mouth daily.    . Omega-3 Fatty Acids (FISH OIL PO) Take by mouth.    Marland Kitchen b complex vitamins capsule Take 1 capsule by mouth daily.     No facility-administered medications prior to visit.   Past Medical History:  Diagnosis Date  . Anginal pain (HCC)   . CAD (coronary artery disease)    s/p stent Dr. Algie Coffer; in 40s stents  . Difficult intubation   . Diverticulitis   . Dysrhythmia   . HLD (hyperlipidemia)   . Hypertension   . Myocardial infarction (HCC)     Feb.2008  . Obesity   . Pneumothorax 2006  . Sleep apnea    CPAP   Past Surgical History:  Procedure Laterality Date  . CARDIAC CATHETERIZATION     with stents  . PLEURAL SCARIFICATION Left 2006  . SHOULDER ARTHROSCOPY WITH OPEN ROTATOR CUFF REPAIR Left 10/12/2017   Procedure: SHOULDER ARTHROSCOPY WITH OPEN ROTATOR CUFF REPAIR;  Surgeon: Deeann Saint, MD;  Location: [facility] ORS;  Service: Orthopedics;  Laterality: Left;  Left shoulder arthroscopy, subaccromial decompression, distal clavical resection and biceps tenotomy  . shoulder joint replacement Left   .  TONSILLECTOMY     Social History   Socioeconomic History  . Marital status: Married    Spouse name: Not on file  . Number of children: Not on file  . Years of education: Not on file  . Highest education level: Not on file  Occupational History  . Not on file  Tobacco Use  . Smoking status: Former Smoker    Quit date: 10/07/2010    Years since quitting: 9.2  . Smokeless tobacco: Never Used  Substance and Sexual Activity  . Alcohol use: Yes    Alcohol/week: 6.0 standard drinks    Types: 3 Glasses of wine, 3 Cans of beer per week    Comment: daily  . Drug use: No  . Sexual activity: Yes    Partners: Male  Other Topics Concern  . 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 Difficulty of Paying Living Expenses: Not on file  Food Insecurity:   . Worried About Charity fundraiser in the Last Year: Not on file  . Ran Out of Food in the Last Year: Not on file  Transportation Needs:   . Lack of Transportation (Medical): Not on file  . Lack of Transportation (Non-Medical): Not on file  Physical Activity:   . Days of Exercise per Week: Not on file  . Minutes of Exercise per Session: Not on file  Stress:   . Feeling of Stress : Not on file  Social Connections:   . Frequency of Communication with Friends and Family: Not on file  . Frequency of Social Gatherings with Friends and Family: Not on file  . Attends Religious Services: Not on file  . Active Member of Clubs or Organizations: Not on file  . Attends Archivist Meetings: Not on file  . Marital Status: Not on file   Family History  Problem Relation Age of Onset  . Depression Mother   . Anemia Mother   . Thyroid disease Mother   . Huntington's disease Mother   . CAD Father        s/p bypass   . Obesity Father   . Huntington's disease Sister   . Depression Sister   . Schizophrenia Brother   . Insomnia Brother   . Colon cancer Neg Hx   . Esophageal cancer Neg Hx   . Pancreatic cancer Neg Hx   . Stomach cancer Neg Hx   . Liver disease Neg Hx      Review of Systems: Pertinent positive and negative review of systems were noted in the above HPI section. All other review of systems were otherwise negative.   Physical Exam: General: Well developed, well nourished, no acute distress Head: Normocephalic and atraumatic Eyes:  sclerae anicteric, EOMI Ears: Normal auditory acuity Mouth: No deformity or lesions Neck: Supple, no  masses or thyromegaly Lungs: Clear throughout to auscultation Heart: Regular rate and rhythm; no murmurs, rubs or bruits Abdomen: Soft, non tender and non distended. No masses, hepatosplenomegaly or hernias noted. Normal Bowel sounds Rectal: Deferred to colonoscopy  Musculoskeletal: Symmetrical with no gross deformities  Skin: No lesions on visible extremities Pulses:  Normal pulses noted Extremities: No  clubbing, cyanosis, edema or deformities noted Neurological: Alert oriented x 4, grossly nonfocal Cervical Nodes:  No significant cervical adenopathy Inguinal Nodes: No significant inguinal adenopathy Psychological:  Alert and cooperative. Normal mood and affect   Assessment and Recommendations:  1. Dark stools/melena. Hematochezia. Patient related history of colon polyps around 2000. R/O colorectal neoplasms, hemorrhoids, IBD, ulcer, gastritis, gastropathy. Schedule colonoscopy, EGD at hospital due to chart history of difficult intubation. Pt does not recall this history. Complete blood work ordered by PCP on 2/10 as soon as possible and notify my office when you have done so for [place] to review. The risks (including bleeding, perforation, infection, missed lesions, medication reactions and possible hospitalization or surgery if complications occur), benefits, and alternatives to colonoscopy with possible biopsy and possible polypectomy were discussed with the patient and they consent to proceed. The risks (including bleeding, perforation, infection, missed lesions, medication reactions and possible hospitalization or surgery if complications occur), benefits, and alternatives to endoscopy with possible biopsy and possible dilation were discussed with the patient and they consent to proceed.   2. Epigastric, nausea for a few months. EGD as above. Reduce etoh intake to no more than 2 drinks per day. Complete blood work ordered by PCP on 2/10 as above.  3. History of diverticulitis.    4. CAD with  RCA stent maintained on Plavix. History of remote MI.  Hold Plavix 5 days before procedure - will instruct when and how to resume after procedure. Low but real risk of cardiovascular event such as heart attack, stroke, embolism, thrombosis or ischemia/infarct of other organs off Plavix explained and need to seek urgent help if this occurs. The patient consents to proceed. Will communicate by phone or EMR with patient's prescribing provider to confirm that holding Plavix is reasonable in this case. Continue ASA 81 mg qd.   5. OSA on CPAP   6. Obesity, BMI=45.5   cc: McLean-Scocuzza, Pasty Spillers, MD [address] [place],  [place] [address]

## 2019-12-18 NOTE — Patient Instructions (Signed)
If you are age 57 or older, your body mass index should be between 23-30. Your Body mass index is 45.51 kg/m. If this is out of the aforementioned range listed, please consider follow up with your Primary Care Provider.  If you are age 62 or younger, your body mass index should be between 19-25. Your Body mass index is 45.51 kg/m. If this is out of the aformentioned range listed, please consider follow up with your Primary Care Provider.   You have been scheduled for an endoscopy and colonoscopy. Please follow the written instructions given to you at your visit today. Please pick up your prep supplies at the pharmacy within the next 1-3 days. If you use inhalers (even only as needed), please bring them with you on the day of your procedure.  Please be sure to get the labs drawn that your primary care has ordered.

## 2019-12-18 NOTE — Telephone Encounter (Signed)
Faxed anticoagulation letter to Dr. Feliz Beam office for procedure at [facility] On January 29, 2020.

## 2019-12-31 NOTE — Telephone Encounter (Signed)
Left a message for patient to return my call. 

## 2019-12-31 NOTE — Telephone Encounter (Signed)
Informed patient per Dr. Algie Coffer he can hold Plavix x 5 days prior to procedures. Patient states he would like to cancel at this time due to cost. He is overwhelmed right now with finances and would like to wait. Informed patient to contact our office when he is ready to reschedule. Patient verbalized understanding.

## 2020-01-14 DIAGNOSIS — R69 Illness, unspecified: Secondary | ICD-10-CM | POA: Diagnosis not present

## 2020-01-15 ENCOUNTER — Other Ambulatory Visit: Payer: Self-pay

## 2020-01-15 ENCOUNTER — Other Ambulatory Visit (INDEPENDENT_AMBULATORY_CARE_PROVIDER_SITE_OTHER): Payer: Medicare HMO

## 2020-01-15 ENCOUNTER — Ambulatory Visit (INDEPENDENT_AMBULATORY_CARE_PROVIDER_SITE_OTHER): Payer: Medicare HMO

## 2020-01-15 ENCOUNTER — Telehealth: Payer: Self-pay

## 2020-01-15 VITALS — BP 146/92 | HR 73

## 2020-01-15 DIAGNOSIS — I10 Essential (primary) hypertension: Secondary | ICD-10-CM

## 2020-01-15 DIAGNOSIS — Z1329 Encounter for screening for other suspected endocrine disorder: Secondary | ICD-10-CM

## 2020-01-15 DIAGNOSIS — Z13818 Encounter for screening for other digestive system disorders: Secondary | ICD-10-CM | POA: Diagnosis not present

## 2020-01-15 DIAGNOSIS — Z125 Encounter for screening for malignant neoplasm of prostate: Secondary | ICD-10-CM

## 2020-01-15 DIAGNOSIS — Z1159 Encounter for screening for other viral diseases: Secondary | ICD-10-CM

## 2020-01-15 DIAGNOSIS — E559 Vitamin D deficiency, unspecified: Secondary | ICD-10-CM

## 2020-01-15 DIAGNOSIS — Z23 Encounter for immunization: Secondary | ICD-10-CM

## 2020-01-15 DIAGNOSIS — R739 Hyperglycemia, unspecified: Secondary | ICD-10-CM | POA: Diagnosis not present

## 2020-01-15 DIAGNOSIS — Z113 Encounter for screening for infections with a predominantly sexual mode of transmission: Secondary | ICD-10-CM | POA: Diagnosis not present

## 2020-01-15 DIAGNOSIS — Z1389 Encounter for screening for other disorder: Secondary | ICD-10-CM

## 2020-01-15 DIAGNOSIS — R69 Illness, unspecified: Secondary | ICD-10-CM | POA: Diagnosis not present

## 2020-01-15 LAB — LIPID PANEL
Cholesterol: 191 mg/dL (ref 0–200)
HDL: 31 mg/dL — ABNORMAL LOW (ref >39.00)
Total CHOL/HDL Ratio: 6
Triglycerides: 410 mg/dL — ABNORMAL HIGH (ref 0.0–149.0)

## 2020-01-15 LAB — CBC WITH DIFFERENTIAL/PLATELET
Basophils Absolute: 0 10*3/uL (ref 0.0–0.1)
Basophils Relative: 0.6 % (ref 0.0–3.0)
Eosinophils Absolute: 0.2 10*3/uL (ref 0.0–0.7)
Eosinophils Relative: 3 % (ref 0.0–5.0)
HCT: 43.3 % (ref 39.0–52.0)
Hemoglobin: 14.5 g/dL (ref 13.0–17.0)
Lymphocytes Relative: 33.4 % (ref 12.0–46.0)
Lymphs Abs: 1.7 10*3/uL (ref 0.7–4.0)
MCHC: 33.6 g/dL (ref 30.0–36.0)
MCV: 95 fl (ref 78.0–100.0)
Monocytes Absolute: 0.5 10*3/uL (ref 0.1–1.0)
Monocytes Relative: 9 % (ref 3.0–12.0)
Neutro Abs: 2.8 10*3/uL (ref 1.4–7.7)
Neutrophils Relative %: 54 % (ref 43.0–77.0)
Platelets: 204 10*3/uL (ref 150.0–400.0)
RBC: 4.56 Mil/uL (ref 4.22–5.81)
RDW: 13 % (ref 11.5–15.5)
WBC: 5.2 10*3/uL (ref 4.0–10.5)

## 2020-01-15 LAB — COMPREHENSIVE METABOLIC PANEL
ALT: 28 U/L (ref 0–53)
AST: 19 U/L (ref 0–37)
Albumin: 4.1 g/dL (ref 3.5–5.2)
Alkaline Phosphatase: 70 U/L (ref 39–117)
BUN: 15 mg/dL (ref 6–23)
CO2: 27 mEq/L (ref 19–32)
Calcium: 9.4 mg/dL (ref 8.4–10.5)
Chloride: 104 mEq/L (ref 96–112)
Creatinine, Ser: 1.05 mg/dL (ref 0.40–1.50)
GFR: 72.94 mL/min (ref >60.00)
Glucose, Bld: 125 mg/dL — ABNORMAL HIGH (ref 70–99)
Potassium: 4.3 mEq/L (ref 3.5–5.1)
Sodium: 139 mEq/L (ref 135–145)
Total Bilirubin: 0.5 mg/dL (ref 0.2–1.2)
Total Protein: 6.7 g/dL (ref 6.0–8.3)

## 2020-01-15 LAB — UNLABELED

## 2020-01-15 LAB — PSA: PSA: 0.98 ng/mL (ref 0.10–4.00)

## 2020-01-15 LAB — VITAMIN D 25 HYDROXY (VIT D DEFICIENCY, FRACTURES): VITD: 44.14 ng/mL (ref 30.00–100.00)

## 2020-01-15 LAB — HEMOGLOBIN A1C: Hgb A1c MFr Bld: 6 % (ref 4.6–6.5)

## 2020-01-15 LAB — LDL CHOLESTEROL, DIRECT: Direct LDL: 111 mg/dL

## 2020-01-15 LAB — TSH: TSH: 2.85 u[IU]/mL (ref 0.35–4.50)

## 2020-01-15 NOTE — Telephone Encounter (Signed)
Pt was in the office this morning for a bp check. Pt wanted to know if he should be taking both aspirin 81 mg and meloxicam together.

## 2020-01-15 NOTE — Progress Notes (Addendum)
Pt presents today for blood pressure check and TDAP vaccine. Pt is currently taking Amlodipine 5 mg daily and metoprolol 25 mg daily. BP in left arm was 136/90 HR 71, right arm was 146/92 HR 73. Tdap was given in right deltoid, IM and pt tolerated well.   Noted BP still elevated will consider adding hctz to meds or increase norvasc to 10 mg   TMS

## 2020-01-16 ENCOUNTER — Encounter: Payer: Self-pay | Admitting: Internal Medicine

## 2020-01-16 DIAGNOSIS — R69 Illness, unspecified: Secondary | ICD-10-CM | POA: Diagnosis not present

## 2020-01-16 DIAGNOSIS — R7303 Prediabetes: Secondary | ICD-10-CM | POA: Insufficient documentation

## 2020-01-16 LAB — HEPATITIS C ANTIBODY
Hepatitis C Ab: NONREACTIVE
SIGNAL TO CUT-OFF: 0.01 (ref <1.00)

## 2020-01-16 LAB — HIV ANTIBODY (ROUTINE TESTING W REFLEX): HIV 1&2 Ab, 4th Generation: NONREACTIVE

## 2020-01-16 NOTE — Telephone Encounter (Signed)
See Bp note for aspirin and mobic   TMS

## 2020-01-17 LAB — URINALYSIS, ROUTINE W REFLEX MICROSCOPIC
Bacteria, UA: NONE SEEN /HPF
Bilirubin Urine: NEGATIVE
Glucose, UA: NEGATIVE
Hgb urine dipstick: NEGATIVE
Hyaline Cast: NONE SEEN /LPF
Ketones, ur: NEGATIVE
Leukocytes,Ua: NEGATIVE
Nitrite: NEGATIVE
Protein, ur: NEGATIVE
Specific Gravity, Urine: 1.017 (ref 1.001–1.03)
Squamous Epithelial / HPF: NONE SEEN /HPF (ref <=5)
WBC, UA: NONE SEEN /HPF (ref 0–5)
pH: 5 (ref 5.0–8.0)

## 2020-01-17 LAB — PAT ID TIQ DOC: Test Affected: 7909

## 2020-01-25 ENCOUNTER — Inpatient Hospital Stay (HOSPITAL_COMMUNITY): Admission: RE | Admit: 2020-01-25 | Payer: Medicare HMO | Source: Ambulatory Visit

## 2020-01-28 ENCOUNTER — Telehealth: Payer: Self-pay | Admitting: Internal Medicine

## 2020-01-28 NOTE — Telephone Encounter (Addendum)
Entered in error do not call this patient re below    sch repeat platelets/CBC 02/15/20  And if pt wants to move old hep B vaccine up before 07/2020 can get then after labs  -if so place on schedule for nurse visit    Does he want to cologuard or have me order it?   TMS

## 2020-01-29 ENCOUNTER — Ambulatory Visit (HOSPITAL_COMMUNITY): Admit: 2020-01-29 | Payer: Medicare HMO | Admitting: Gastroenterology

## 2020-01-29 ENCOUNTER — Encounter (HOSPITAL_COMMUNITY): Payer: Self-pay

## 2020-01-29 SURGERY — COLONOSCOPY WITH PROPOFOL
Anesthesia: Monitor Anesthesia Care

## 2020-01-30 ENCOUNTER — Telehealth: Payer: Self-pay | Admitting: Internal Medicine

## 2020-01-30 NOTE — Telephone Encounter (Signed)
Pt informed on labs results.  Pt declines adding medication at this time. States he will lower his Cholesterol naturally. Hand out mailed. States he rarely misses any medication but has.   Please advise on medication.

## 2020-01-30 NOTE — Telephone Encounter (Signed)
Please advise on medication

## 2020-01-30 NOTE — Telephone Encounter (Signed)
Pt returned call regarding lab results. He also needs a prescription refill on fenofibrate miconized. He was wondering if he could get the 140mg  instead of 134 mg due to the cost of medicine? He would like a call back.

## 2020-02-04 ENCOUNTER — Other Ambulatory Visit: Payer: Self-pay | Admitting: Internal Medicine

## 2020-02-04 DIAGNOSIS — E785 Hyperlipidemia, unspecified: Secondary | ICD-10-CM

## 2020-02-04 MED ORDER — FENOFIBRATE 145 MG PO TABS: 145.0000 mg | ORAL_TABLET | Freq: Every day | ORAL | 3 refills | Status: DC

## 2020-02-04 NOTE — Telephone Encounter (Signed)
He is on lipitor and fenofibrate for cholesterol  Sent 145 fenofibrate dose to [facility]  Rec healthy diet and exercise   BP still slightly elevated on metoprolol and norvasc  -does he want to increase norvasc to 10 mg ? Max dose but side effects leg swelling?   Or  Add a fluid pill to better keep BP <130/<80?  Hctz fluid pill he would take in the am ?   Also aspirin 81 mg ok and mobic as needed but not daily side effects nsaids stomach ulcer/bleed, elevated blood pressure, elevated kidney #s long term use   Tylenol safer   TMS

## 2020-02-05 MED ORDER — FENOFIBRATE 145 MG PO TABS: 145.0000 mg | ORAL_TABLET | Freq: Every day | ORAL | 3 refills | Status: DC

## 2020-02-05 NOTE — Telephone Encounter (Signed)
Patient informed and verbalized understanding.  Pt wanted fenofibrate sent in to [facility] and not [facility].   Pt is agreeable to increasing his BP medication dose to 10 mg. He currently still has 5 mg pills. States he will double these and pick up a new script when he runs out. Would like this sent in to [facility].   Pt states he will stop taking the mobic and will take the aspirin 81 every other day.

## 2020-02-05 NOTE — Addendum Note (Signed)
Addended by: Tilford Pillar on: 02/05/2020 04:01 PM   Modules accepted: Orders

## 2020-02-08 ENCOUNTER — Other Ambulatory Visit: Payer: Self-pay | Admitting: Internal Medicine

## 2020-02-08 DIAGNOSIS — E785 Hyperlipidemia, unspecified: Secondary | ICD-10-CM

## 2020-02-08 DIAGNOSIS — I10 Essential (primary) hypertension: Secondary | ICD-10-CM

## 2020-02-08 MED ORDER — FENOFIBRATE 145 MG PO TABS: 145.0000 mg | ORAL_TABLET | Freq: Every day | ORAL | 3 refills | Status: DC

## 2020-02-08 MED ORDER — AMLODIPINE BESYLATE 10 MG PO TABS: 10.0000 mg | ORAL_TABLET | Freq: Every day | ORAL | 3 refills | Status: DC

## 2020-02-13 ENCOUNTER — Telehealth: Payer: Self-pay | Admitting: Internal Medicine

## 2020-02-13 NOTE — Telephone Encounter (Signed)
Liver kidneys normal  Vitamin D normal  Triglycerides elevated  -mail cholesterol handout  -is he missing doses of lipitor?  -would he be agreeable to add zetia to lipitor?    Blood cts normal  A1C=prediabetes  -rec healthy diet and    Thyroid lab normal  PSA normal prostate cancer screening  Hep C and HIV negative    Urine normal  Patient informed and verbalized understanding.  States he does miss some of his lipitor dosees. Handout mailed and pt will start taking the medication every day

## 2020-02-13 NOTE — Telephone Encounter (Signed)
Pt called for lab results 

## 2020-03-12 ENCOUNTER — Encounter: Payer: Self-pay | Admitting: Internal Medicine

## 2020-03-12 ENCOUNTER — Ambulatory Visit (INDEPENDENT_AMBULATORY_CARE_PROVIDER_SITE_OTHER): Payer: Medicare HMO | Admitting: Internal Medicine

## 2020-03-12 ENCOUNTER — Other Ambulatory Visit: Payer: Self-pay

## 2020-03-12 VITALS — BP 142/82 | HR 92 | Temp 98.5°F | Ht 70.0 in | Wt 308.4 lb

## 2020-03-12 DIAGNOSIS — M87051 Idiopathic aseptic necrosis of right femur: Secondary | ICD-10-CM

## 2020-03-12 DIAGNOSIS — L918 Other hypertrophic disorders of the skin: Secondary | ICD-10-CM | POA: Diagnosis not present

## 2020-03-12 DIAGNOSIS — I10 Essential (primary) hypertension: Secondary | ICD-10-CM

## 2020-03-12 DIAGNOSIS — Z1283 Encounter for screening for malignant neoplasm of skin: Secondary | ICD-10-CM

## 2020-03-12 DIAGNOSIS — M87052 Idiopathic aseptic necrosis of left femur: Secondary | ICD-10-CM

## 2020-03-12 DIAGNOSIS — R110 Nausea: Secondary | ICD-10-CM | POA: Diagnosis not present

## 2020-03-12 DIAGNOSIS — R7303 Prediabetes: Secondary | ICD-10-CM | POA: Diagnosis not present

## 2020-03-12 DIAGNOSIS — E785 Hyperlipidemia, unspecified: Secondary | ICD-10-CM | POA: Diagnosis not present

## 2020-03-12 DIAGNOSIS — G4733 Obstructive sleep apnea (adult) (pediatric): Secondary | ICD-10-CM

## 2020-03-12 DIAGNOSIS — Z9989 Dependence on other enabling machines and devices: Secondary | ICD-10-CM

## 2020-03-12 MED ORDER — EZETIMIBE 10 MG PO TABS: 10.0000 mg | ORAL_TABLET | Freq: Every day | ORAL | 3 refills | Status: DC

## 2020-03-12 MED ORDER — LOSARTAN POTASSIUM 25 MG PO TABS: 25.0000 mg | ORAL_TABLET | Freq: Every day | ORAL | 3 refills | Status: DC

## 2020-03-12 MED ORDER — ONDANSETRON HCL 4 MG PO TABS: 4.0000 mg | ORAL_TABLET | Freq: Two times a day (BID) | ORAL | 5 refills | Status: DC | PRN

## 2020-03-12 NOTE — Progress Notes (Signed)
Chief Complaint  Patient presents with  . Follow-up   F/u  1. Skin tag removal to neck and tbse needs derm referral 2. HTN BP elevated today on norvasc 10 mg qd, toprol 25 xl qd  3. Chronic nausea  4. Chronic pain right hip thought likely due to chronic steroids emerge ortho in [place] due to hip and low back pain  For now declines to see pain clinic though rec as he is asking for tramadol and declines ortho disc Dr. Alvan Dame, [place] in [place] if needed in future  Pt declines pain clinif today  Pain is 10/10 right >left hip and had 2 falls in the past walking with crutch today  MRI 08/02/2019  08/02/2019  IMPRESSION: 1. Avascular necrosis in the right femoral head with mild reactive edema in the head/neck, and a moderate joint effusion. 2. Small focus of chronic avascular necrosis in the left femoral head, without associated edema or effusion  Weight bearing exercise makes worse   Disc consider DEXA pt declines for now  5. Prediabetes/HLD he tried swimming recently on lipitor 40 and tricor 145 mg qd  Review of Systems  Constitutional: Negative for weight loss.  HENT: Negative for hearing loss.   Eyes: Negative for blurred vision.  Respiratory: Negative for shortness of breath.   Cardiovascular: Negative for chest pain.  Gastrointestinal: Positive for nausea. Negative for abdominal pain.  Musculoskeletal: Positive for back pain and joint pain.  Skin: Negative for rash.  Psychiatric/Behavioral: Negative for depression.   Past Medical History:  Diagnosis Date  . Anginal pain ([place])   . CAD (coronary artery disease)    s/p stent Dr. Doylene Canard; in 22s stents  . Difficult intubation   . Diverticulitis   . Dysrhythmia   . HLD (hyperlipidemia)   . Hypertension   . Myocardial infarction ([place])     Feb.2008  . Obesity   . Pneumothorax 2006  . Sleep apnea    CPAP   Past Surgical History:  Procedure Laterality Date  . CARDIAC CATHETERIZATION     with stents  . PLEURAL SCARIFICATION  Left 2006  . SHOULDER ARTHROSCOPY WITH OPEN ROTATOR CUFF REPAIR Left 10/12/2017   Procedure: SHOULDER ARTHROSCOPY WITH OPEN ROTATOR CUFF REPAIR;  Surgeon: Earnestine Leys, MD;  Location: [facility] ORS;  Service: Orthopedics;  Laterality: Left;  Left shoulder arthroscopy, subaccromial decompression, distal clavical resection and biceps tenotomy  . shoulder joint replacement Left    surgery x 2 Dr. Redmond Pulling ortho  . TONSILLECTOMY     Family History  Problem Relation Age of Onset  . Depression Mother   . Anemia Mother   . Thyroid disease Mother   . Huntington's disease Mother   . CAD Father        s/p bypass   . Obesity Father   . Huntington's disease Sister   . Depression Sister   . Schizophrenia Brother   . Insomnia Brother   . Colon cancer Neg Hx   . Esophageal cancer Neg Hx   . Pancreatic cancer Neg Hx   . Stomach cancer Neg Hx   . Liver disease Neg Hx    Social History   Socioeconomic History  . Marital status: Married    Spouse name: Not on file  . Number of children: Not on file  . Years of education: Not on file  . Highest education level: Not on file  Occupational History  . Not on file  Tobacco Use  . Smoking status: Current Some Day Smoker  Types: Cigarettes    Last attempt to quit: 10/07/2010    Years since quitting: 9.4  . Smokeless tobacco: Never Used  Substance and Sexual Activity  . Alcohol use: Yes    Alcohol/week: 6.0 standard drinks    Types: 3 Glasses of wine, 3 Cans of beer per week    Comment: daily  . Drug use: No  . Sexual activity: Yes    Partners: Male  Other Topics Concern  . 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 Difficulty of Paying Living Expenses:   Food Insecurity:   .  Worried About Programme researcher, broadcasting/film/video in the Last Year:   . Barista in the Last Year:   Transportation Needs:   . Freight forwarder (Medical):   Marland Kitchen Lack of Transportation (Non-Medical):   Physical Activity:   . Days of Exercise per Week:   . Minutes of Exercise per Session:   Stress:   . Feeling of Stress :   Social Connections:   . Frequency of Communication with Friends and Family:   . Frequency of Social Gatherings with Friends and Family:   . Attends Religious Services:   . Active Member of Clubs or Organizations:   . Attends Banker Meetings:   Marland Kitchen Marital Status:   Intimate Partner Violence:   . Fear of Current or Ex-Partner:   . Emotionally Abused:   Marland Kitchen Physically Abused:   . Sexually Abused:    Current Meds  Medication Sig  . albuterol (ACCUNEB) 0.63 MG/3ML nebulizer solution Take 1 ampule by nebulization every 6 (six) hours as needed for wheezing.  Marland Kitchen amLODipine (NORVASC) 10 MG tablet Take 1 tablet (10 mg total) by mouth daily.  Marland Kitchen aspirin EC 81 MG tablet Take 81 mg by mouth daily.  Marland Kitchen atorvastatin (LIPITOR) 40 MG tablet Take 1 tablet (40 mg total) by mouth daily at 6 PM.  . b complex vitamins capsule Take 1 capsule by mouth daily.  . chlorzoxazone (PARAFON) 500 MG tablet Take 500 mg by mouth daily as needed for muscle spasms.  . Cholecalciferol (VITAMIN D3) 50 MCG (2000 UT) TABS Take 6,000 tablets by mouth.  . clopidogrel (PLAVIX) 75 MG tablet Take 75 mg by mouth daily.  . Ginkgo Biloba 40 MG TABS Take 1 tablet by mouth daily.  . methocarbamol (ROBAXIN) 500 MG tablet Take 500 mg by mouth 2 (two) times daily as needed for pain.  . metoprolol succinate (TOPROL-XL) 25 MG 24 hr tablet Take 1 tablet (25 mg total) by mouth daily.  . Multiple Vitamin (MULTIVITAMIN WITH MINERALS) TABS tablet Take 1 tablet by mouth daily.  . Omega-3 Fatty Acids (FISH OIL PO) Take by mouth.  . [DISCONTINUED] fenofibrate (TRICOR) 145 MG tablet Take 1 tablet (145 mg total) by mouth  daily.   Allergies  Allergen Reactions  . No Known Allergies    Recent Results (from the past 2160 hour(s))  PSA     Status: None   Collection Time: 01/15/20  9:19 AM  Result Value Ref Range   PSA 0.98 0.10 - 4.00 ng/mL    Comment: Test performed using Access [employer] PSA  Assay, a parmagnetic partical, chemiluminecent immunoassay.  Vitamin D (25 hydroxy)     Status: None   Collection Time: 01/15/20  9:19 AM  Result Value Ref Range   VITD 44.14 30.00 - 100.00 ng/mL  HIV antibody (with reflex)     Status: None   Collection Time: 01/15/20  9:19 AM  Result Value Ref Range   HIV 1&2 Ab, 4th Generation NON-REACTIVE NON-REACTI    Comment: HIV-1 antigen and HIV-1/HIV-2 antibodies were not detected. There is no laboratory evidence of HIV infection. Marland Kitchen PLEASE NOTE: This information has been disclosed to you from records whose confidentiality may be protected by state law.  If your state requires such protection, then the state law prohibits you from making any further disclosure of the information without the specific written consent of the person to whom it pertains, or as otherwise permitted by law. A general authorization for the release of medical or other information is NOT sufficient for this purpose. . For additional information please refer to http://education.[facility].com/faq/FAQ106 (This link is being provided for informational/ educational purposes only.) . Marland Kitchen The performance of this assay has not been clinically validated in patients less than 93 years old. .   Hepatitis C antibody     Status: None   Collection Time: 01/15/20  9:19 AM  Result Value Ref Range   Hepatitis C Ab NON-REACTIVE NON-REACTI   SIGNAL TO CUT-OFF 0.01 <1.00    Comment: . HCV antibody was non-reactive. There is no laboratory  evidence of HCV infection. . In most cases, no further action is required. However, if recent HCV exposure is suspected, a test for HCV RNA (test code 67124) is  suggested. . For additional information please refer to http://education.[facility].com/faq/FAQ22v1 (This link is being provided for informational/ educational purposes only.) .   Hemoglobin A1c     Status: None   Collection Time: 01/15/20  9:19 AM  Result Value Ref Range   Hgb A1c MFr Bld 6.0 4.6 - 6.5 %    Comment: Glycemic Control Guidelines for People with Diabetes:Non Diabetic:  <6%Goal of Therapy: <7%Additional Action Suggested:  >8%   Urinalysis, Routine w reflex microscopic     Status: None   Collection Time: 01/15/20  9:19 AM  Result Value Ref Range   Color, Urine YELLOW YELLOW   APPearance CLEAR CLEAR   Specific Gravity, Urine 1.017 1.001 - 1.03   pH 5.0 5.0 - 8.0   Glucose, UA NEGATIVE NEGATIVE   Bilirubin Urine NEGATIVE NEGATIVE   Ketones, ur NEGATIVE NEGATIVE   Hgb urine dipstick NEGATIVE NEGATIVE   Protein, ur NEGATIVE NEGATIVE   Nitrite NEGATIVE NEGATIVE   Leukocytes,Ua NEGATIVE NEGATIVE   WBC, UA NONE SEEN 0 - 5 /HPF   RBC / HPF 0-2 0 - 2 /HPF   Squamous Epithelial / LPF NONE SEEN < OR = 5 /HPF   Bacteria, UA NONE SEEN NONE SEEN /HPF   Calcium Oxalate Crystal FEW NONE OR FE /HPF   Hyaline Cast NONE SEEN NONE SEEN /LPF  TSH     Status: None   Collection Time: 01/15/20  9:19 AM  Result Value Ref Range   TSH 2.85 0.35 - 4.50 uIU/mL  CBC with Differential/Platelet     Status: None   Collection Time: 01/15/20  9:19 AM  Result Value Ref Range   WBC 5.2 4.0 - 10.5 K/uL   RBC 4.56 4.22 - 5.81 Mil/uL   Hemoglobin 14.5 13.0 - 17.0 g/dL   HCT 58.0 99.8 - 33.8 %  MCV 95.0 78.0 - 100.0 fl   MCHC 33.6 30.0 - 36.0 g/dL   RDW 16.1 09.6 - 04.5 %   Platelets 204.0 150.0 - 400.0 K/uL   Neutrophils Relative % 54.0 43.0 - 77.0 %   Lymphocytes Relative 33.4 12.0 - 46.0 %   Monocytes Relative 9.0 3.0 - 12.0 %   Eosinophils Relative 3.0 0.0 - 5.0 %   Basophils Relative 0.6 0.0 - 3.0 %   Neutro Abs 2.8 1.4 - 7.7 K/uL   Lymphs Abs 1.7 0.7 - 4.0 K/uL   Monocytes  Absolute 0.5 0.1 - 1.0 K/uL   Eosinophils Absolute 0.2 0.0 - 0.7 K/uL   Basophils Absolute 0.0 0.0 - 0.1 K/uL  Lipid panel     Status: Abnormal   Collection Time: 01/15/20  9:19 AM  Result Value Ref Range   Cholesterol 191 0 - 200 mg/dL    Comment: ATP III Classification       Desirable:  < 200 mg/dL               Borderline High:  200 - 239 mg/dL          High:  > = 409 mg/dL   Triglycerides (H) 0.0 - 149.0 mg/dL    811.9 Triglyceride is over 400; calculations on Lipids are invalid.    Comment: Normal:  <150 mg/dLBorderline High:  150 - 199 mg/dL   HDL 14.78 (L) >29.56 mg/dL   Total CHOL/HDL Ratio 6     Comment:                Men          Women1/2 Average Risk     3.4          3.3Average Risk          5.0          4.42X Average Risk          9.6          7.13X Average Risk          15.0          11.0                      Comprehensive metabolic panel     Status: Abnormal   Collection Time: 01/15/20  9:19 AM  Result Value Ref Range   Sodium 139 135 - 145 mEq/L   Potassium 4.3 3.5 - 5.1 mEq/L   Chloride 104 96 - 112 mEq/L   CO2 27 19 - 32 mEq/L   Glucose, Bld 125 (H) 70 - 99 mg/dL   BUN 15 6 - 23 mg/dL   Creatinine, Ser 2.13 0.40 - 1.50 mg/dL   Total Bilirubin 0.5 0.2 - 1.2 mg/dL   Alkaline Phosphatase 70 39 - 117 U/L   AST 19 0 - 37 U/L   ALT 28 0 - 53 U/L   Total Protein 6.7 6.0 - 8.3 g/dL   Albumin 4.1 3.5 - 5.2 g/dL   GFR 08.65 >78.46 mL/min   Calcium 9.4 8.4 - 10.5 mg/dL  UNLABELED     Status: None   Collection Time: 01/15/20  9:19 AM  Result Value Ref Range   Message      Comment: . The labeling of the requisition and/or specimen(s) is in question. Marland Kitchen    Specimen Type U0    Test Ordered On Req UA     Comment: REQUESTED INFORMATION _________________________________ . AUTHORIZED  SIGNATURE __________________________________ . TO PREVENT FURTHER DELAYS IN TESTING, PLEASE COMPLETE INFORMATION ABOVE AND EITHER FAX TO [phone] OR  EMAIL TO  [email or web address] TO  RESOLVE THIS ORDER.   LDL cholesterol, direct     Status: None   Collection Time: 01/15/20  9:19 AM  Result Value Ref Range   Direct LDL 111.0 mg/dL    Comment: Optimal:  <921 mg/dLNear or Above Optimal:  100-129 mg/dLBorderline High:  130-159 mg/dLHigh:  160-189 mg/dLVery High:  >190 mg/dL  PAT ID [id number] DOC     Status: None   Collection Time: 01/15/20  9:19 AM  Result Value Ref Range   COMMENT      Comment: Identification of test requisition and/or specimen(s) was questionable. The below named individual provided this revised patient identification.    Contact ALIYAH WILSON    Test Affected [phone]    Objective  Body mass index is 44.25 kg/m. Wt Readings from Last 3 Encounters:  03/12/20 (!) 308 lb 6.4 oz (139.9 kg)  12/18/19 (!) 317 lb 3.2 oz (143.9 kg)  10/12/17 285 lb (129.3 kg)   Temp Readings from Last 3 Encounters:  03/12/20 98.5 F (36.9 C) (Oral)  12/18/19 98.5 F (36.9 C)  10/12/17 (!) 97.3 F (36.3 C)   BP Readings from Last 3 Encounters:  03/12/20 (!) 142/82  01/15/20 (!) 146/92  12/18/19 112/62   Pulse Readings from Last 3 Encounters:  03/12/20 92  01/15/20 73  12/18/19 80    Physical Exam Vitals and nursing note reviewed.  Constitutional:      Appearance: Normal appearance. He is well-developed and well-groomed. He is morbidly obese.  HENT:     Head: Normocephalic and atraumatic.  Eyes:     Conjunctiva/sclera: Conjunctivae normal.     Pupils: Pupils are equal, round, and reactive to light.  Cardiovascular:     Rate and Rhythm: Normal rate and regular rhythm.     Heart sounds: Normal heart sounds. No murmur.  Pulmonary:     Effort: Pulmonary effort is normal.     Breath sounds: Normal breath sounds.  Musculoskeletal:       Legs:  Skin:    General: Skin is warm and dry.  Neurological:     General: No focal deficit present.     Mental Status: He is alert and oriented to person, place, and time. Mental  status is at baseline.     Comments: Walking slow with 1 crutch   Psychiatric:        Attention and Perception: Attention and perception normal.        Mood and Affect: Mood and affect normal.        Speech: Speech normal.        Behavior: Behavior normal. Behavior is cooperative.        Thought Content: Thought content normal.        Cognition and Memory: Cognition and memory normal.        Judgment: Judgment normal.     Assessment  Plan  Avascular necrosis of bones of both hips (HCC) R>L  rec pain clinic referral and ortho referral see HPI  Prediabetes Hyperlipidemia, unspecified hyperlipidemia type - Plan: ezetimibe (ZETIA) 10 MG tablet rec healthy diet and exercise  D/c tricor 145 cont lipitor 40 mg   Nausea - Plan: ondansetron (ZOFRAN) 4 MG tablet  Essential hypertension - Plan: losartan (COZAAR) 25 MG tablet Cont other meds   Skin tag - Plan: Ambulatory referral to Dermatology Skin cancer screening -  Plan: Ambulatory referral to Dermatology   HM Flu shot had [facility] 06/2019 Tdap utd   Consider shingrix vaccine  covid 19 vx 2/2   normal PSA 01/15/20 Colonoscopy referred [facility] Gi Dr. Anselm JunglingStarks referred 12/2019 pt never scheduled given # to call for future  Skin referred derm today Smoker since age 89 y.o light per pt always < 1 ppd on and off 1 pk lasting 1-3 months x 45 years on and off  Consider testing for HD in future mother died of HD, 1/2 has gene as well  [place] [facility] per pt needs to resch stress test last seen 09/2019   Provider: Dr. French Anaracy McLean-Scoc[facility]

## 2020-03-12 NOTE — Patient Instructions (Addendum)
08/02/2019  IMPRESSION: 1. Avascular necrosis in the right femoral head with mild reactive edema in the head/neck, and a moderate joint effusion. 2. Small focus of chronic avascular necrosis in the left femoral head, without associated edema or effusion  Pain clinic  1. Dr. Welton Flakes (pain clinic) [facility]  2. [facility] pain clinic   Ortho in [facility] Dr. Charlann Boxer  Dr. Despina Hick  Dr. Sherlean Foot  Over the counter  Lidocaine pain patch  Voltaren gel  Tylenol Hip Exercises Ask your health care provider which exercises are safe for you. Do exercises exactly as told by your health care provider and adjust them as directed. It is normal to feel mild stretching, pulling, tightness, or discomfort as you do these exercises. Stop right away if you feel sudden pain or your pain gets worse. Do not begin these exercises until told by your health care provider. Stretching and range-of-motion exercises These exercises warm up your muscles and joints and improve the movement and flexibility of your hip. These exercises also help to relieve pain, numbness, and tingling. You may be asked to limit your range of motion if you had a hip replacement. Talk to your health care provider about these restrictions. Hamstrings, supine  1. Lie on your back (supine position). 2. Loop a belt or towel over the ball of your left / right foot. The ball of your foot is on the walking surface, right under your toes. 3. Straighten your left / right knee and slowly pull on the belt or towel to raise your leg until you feel a gentle stretch behind your knee (hamstring). ? Do not let your knee bend while you do this. ? Keep your other leg flat on the floor. 4. Hold this position for __________ seconds. 5. Slowly return your leg to the starting position. Repeat __________ times. Complete this exercise __________ times a day. Hip rotation  1. Lie on your back on a firm surface. 2. With your left / right hand, gently pull your left / right  knee toward the shoulder that is on the same side of the body. Stop when your knee is pointing toward the ceiling. 3. Hold your left / right ankle with your other hand. 4. Keeping your knee steady, gently pull your left / right ankle toward your other shoulder until you feel a stretch in your buttocks. ? Keep your hips and shoulders firmly planted while you do this stretch. 5. Hold this position for __________ seconds. Repeat __________ times. Complete this exercise __________ times a day. Seated stretch This exercise is sometimes called hamstrings and adductors stretch. 1. Sit on the floor with your legs stretched wide. Keep your knees straight during this exercise. 2. Keeping your head and back in a straight line, bend at your waist to reach for your left foot (position A). You should feel a stretch in your right inner thigh (adductors). 3. Hold this position for __________ seconds. Then slowly return to the upright position. 4. Keeping your head and back in a straight line, bend at your waist to reach forward (position B). You should feel a stretch behind both of your thighs and knees (hamstrings). 5. Hold this position for __________ seconds. Then slowly return to the upright position. 6. Keeping your head and back in a straight line, bend at your waist to reach for your right foot (position C). You should feel a stretch in your left inner thigh (adductors). 7. Hold this position for __________ seconds. Then slowly return to the  upright position. Repeat __________ times. Complete this exercise __________ times a day. Lunge This exercise stretches the muscles of the hip (hip flexors). 1. Place your left / right knee on the floor and bend your other knee so that is directly over your ankle. You should be half-kneeling. 2. Keep good posture with your head over your shoulders. 3. Tighten your buttocks to point your tailbone downward. This will prevent your back from arching too much. 4. You  should feel a gentle stretch in the front of your left / right thigh and hip. If you do not feel a stretch, slide your other foot forward slightly and then slowly lunge forward with your chest up until your knee once again lines up over your ankle. ? Make sure your tailbone continues to point downward. 5. Hold this position for __________ seconds. 6. Slowly return to the starting position. Repeat __________ times. Complete this exercise __________ times a day. Strengthening exercises These exercises build strength and endurance in your hip. Endurance is the ability to use your muscles for a long time, even after they get tired. Bridge This exercise strengthens the muscles of your hip (hip extensors). 1. Lie on your back on a firm surface with your knees bent and your feet flat on the floor. 2. Tighten your buttocks muscles and lift your bottom off the floor until the trunk of your body and your hips are level with your thighs. ? Do not arch your back. ? You should feel the muscles working in your buttocks and the back of your thighs. If you do not feel these muscles, slide your feet 1-2 inches (2.5-5 cm) farther away from your buttocks. 3. Hold this position for __________ seconds. 4. Slowly lower your hips to the starting position. 5. Let your muscles relax completely between repetitions. Repeat __________ times. Complete this exercise __________ times a day. Straight leg raises, side-lying This exercise strengthens the muscles that move the hip joint away from the center of the body (hip abductors). 1. Lie on your side with your left / right leg in the top position. Lie so your head, shoulder, hip, and knee line up. You may bend your bottom knee slightly to help you balance. 2. Roll your hips slightly forward, so your hips are stacked directly over each other and your left / right knee is facing forward. 3. Leading with your heel, lift your top leg 4-6 inches (10-15 cm). You should feel the  muscles in your top hip lifting. ? Do not let your foot drift forward. ? Do not let your knee roll toward the ceiling. 4. Hold this position for __________ seconds. 5. Slowly return to the starting position. 6. Let your muscles relax completely between repetitions. Repeat __________ times. Complete this exercise __________ times a day. Straight leg raises, side-lying This exercise strengthens the muscles that move the hip joint toward the center of the body (hip adductors). 1. Lie on your side with your left / right leg in the bottom position. Lie so your head, shoulder, hip, and knee line up. You may place your upper foot in front to help you balance. 2. Roll your hips slightly forward, so your hips are stacked directly over each other and your left / right knee is facing forward. 3. Tense the muscles in your inner thigh and lift your bottom leg 4-6 inches (10-15 cm). 4. Hold this position for __________ seconds. 5. Slowly return to the starting position. 6. Let your muscles relax completely between repetitions.  Repeat __________ times. Complete this exercise __________ times a day. Straight leg raises, supine This exercise strengthens the muscles in the front of your thigh (quadriceps). 1. Lie on your back (supine position) with your left / right leg extended and your other knee bent. 2. Tense the muscles in the front of your left / right thigh. You should see your kneecap slide up or see increased dimpling just above your knee. 3. Keep these muscles tight as you raise your leg 4-6 inches (10-15 cm) off the floor. Do not let your knee bend. 4. Hold this position for __________ seconds. 5. Keep these muscles tense as you lower your leg. 6. Relax the muscles slowly and completely between repetitions. Repeat __________ times. Complete this exercise __________ times a day. Hip abductors, standing This exercise strengthens the muscles that move the leg and hip joint away from the center of the  body (hip abductors). 1. Tie one end of a rubber exercise band or tubing to a secure surface, such as a chair, table, or pole. 2. Loop the other end of the band or tubing around your left / right ankle. 3. Keeping your ankle with the band or tubing directly opposite the secured end, step away until there is tension in the tubing or band. Hold on to a chair, table, or pole as needed for balance. 4. Lift your left / right leg out to your side. While you do this: ? Keep your back upright. ? Keep your shoulders over your hips. ? Keep your toes pointing forward. ? Make sure to use your hip muscles to slowly lift your leg. Do not tip your body or forcefully lift your leg. 5. Hold this position for __________ seconds. 6. Slowly return to the starting position. Repeat __________ times. Complete this exercise __________ times a day. Squats This exercise strengthens the muscles in the front of your thigh (quadriceps). 1. Stand in a door frame so your feet and knees are in line with the frame. You may place your hands on the frame for balance. 2. Slowly bend your knees and lower your hips like you are going to sit in a chair. ? Keep your lower legs in a straight-up-and-down position. ? Do not let your hips go lower than your knees. ? Do not bend your knees lower than told by your health care provider. ? If your hip pain increases, do not bend as low. 3. Hold this position for ___________ seconds. 4. Slowly push with your legs to return to standing. Do not use your hands to pull yourself to standing. Repeat __________ times. Complete this exercise __________ times a day. This information is not intended to replace advice given to you by your health care provider. Make sure you discuss any questions you have with your health care provider. Document Revised: 05/24/2019 Document Reviewed: 08/29/2018 Elsevier Patient Education  2020 [employer]   Avascular Necrosis Avascular necrosis is death of bone  tissue due to lack of blood supply. This condition may also be called:  Osteonecrosis.  Aseptic necrosis.  Ischemic bone necrosis. Without proper blood supply, the internal layer of the affected bone dies. Over time, small breaks form in the outer layer of the bone. If this process affects a bone near a joint, the joint may collapse or move out of place (become dislocated). Joints that may be affected include the jaw, wrist, fingers, knee, and foot. Avascular necrosis most commonly affects:  The hip joint, especially the top of the thigh bone (  femoral head).  The top of the upper arm bone (humeral head). What are the causes? This condition may be caused by:  Damage or injury to a bone or joint.  Using steroid medicine such as prednisone for a long time.  Changes in the body's disease-fighting system (immune system).  Changes in the body's system of chemicals that regulate body processes (hormones).  A lot of exposure to radiation. What increases the risk? The following factors may make you more likely to develop this condition:  Alcohol abuse.  A history of joint injury.  Long-term or frequent use of steroid medicines.  Having a medical condition such as: ? HIV or AIDS. ? Diabetes. ? Sickle cell disease. ? A disease in which your body's immune system attacks your body's own healthy tissues (autoimmune disease). What are the signs or symptoms? The main symptoms of avascular necrosis are:  Pain. If an affected joint collapses, the pain may suddenly get severe.  Decreased ability to move the affected bone or joint. How is this diagnosed? Avascular necrosis may be diagnosed based on:  Your symptoms and medical history.  A physical exam.  Imaging tests, such as X-rays, bone scans, and an MRI. How is this treated? Treatment for this condition may include:  Pain medicine, such as NSAIDs.  Medicine to improve bone growth.  Avoiding placing any pressure or weight on  the affected area. If avascular necrosis occurs in your hip, ankle, or foot, you may need to use a device to help you move around (assistive device), such as crutches or a rolling scooter.  Physical therapy to help regain strength and motion in the affected area.  Surgery, such as: ? Core decompression. In this surgery, one or more holes are placed in the bone for new blood vessels to grow into. This provides a renewed blood supply to the bone. This surgery may reduce pain and pressure in the affected bone and slow the destruction of bones and joints. ? Osteotomy. In this surgery, the bone is reshaped to reduce stress on the affected area of the joint. ? Bone grafting. In this surgery, healthy bone from a different part of your body is used to replace damaged bone. ? Total joint replacement (arthroplasty). In this surgery, the affected surfaces of bone on one or both sides of a joint are replaced with artificial parts (prostheses).  Electrical stimulation (also called e-stim). During this procedure, a probe over the skin sends shock waves into the body. This may help to encourage new bone growth.  High-pressure oxygen therapy (hyperbaric oxygen). This is rarely used. Follow these instructions at home: Activity  Ask your health care provider what activities are safe for you.  If physical therapy was prescribed, do exercises as told by your health care provider.  Avoid placing any pressure or weight on the affected area, as told by your health care provider. Use assistive devices as instructed. Managing pain, stiffness, and swelling  If directed, apply heat to the affected area as often as told by your health care provider. Heat can reduce the stiffness of your muscles and joints. Use the heat source that your health care provider recommends, such as a moist heat pack or a heating pad. ? Place a towel between your skin and the heat source. ? Leave the heat on for 20-30 minutes. ? Remove the  heat if your skin turns bright red. This is especially important if you are unable to feel pain, heat, or cold. You may have a  greater risk of getting burned.  If directed, put ice on affected areas. Icing can help to relieve joint pain and swelling. ? Put ice in a plastic bag. ? Place a towel between your skin and the bag. ? Leave the ice on for 20 minutes, 2-3 times a day. General instructions   Take over-the-counter and prescription medicines only as told by your health care provider.  Do not drive or use heavy machinery while taking prescription pain medicine.  If a bone in your arm or leg is affected, ask your health care provider if it is safe for you to drive.  Do not use any products that contain nicotine or tobacco, such as cigarettes and e-cigarettes. These can delay bone healing. If you need help quitting, ask your health care provider.  Do not drink alcohol.  Keep all follow-up visits as told by your health care provider. This is important. Contact a health care provider if:  You have pain that gets worse or does not get better with medicine.  Your ability to move your joint gets worse. Get help right away if:  Your pain suddenly becomes severe. Summary  Avascular necrosis is death of bone tissue due to a lack of blood supply.  Without proper blood supply, the internal layer of the affected bone dies. Over time, small breaks form in the outer layer of the bone.  Avoid putting any pressure or weight on the affected area. You may need to use a device to help you move around (assistive device), such as crutches or a rolling scooter. This information is not intended to replace advice given to you by your health care provider. Make sure you discuss any questions you have with your health care provider. Document Revised: 09/30/2017 Document Reviewed: 09/27/2017 Elsevier Patient Education  2020 [employer]   Prediabetes Eating Plan Prediabetes is a condition that  causes blood sugar (glucose) levels to be higher than normal. This increases the risk for developing diabetes. In order to prevent diabetes from developing, your health care provider may recommend a diet and other lifestyle changes to help you:  Control your blood glucose levels.  Improve your cholesterol levels.  Manage your blood pressure. Your health care provider may recommend working with a diet and nutrition specialist (dietitian) to make a meal plan that is best for you. What are tips for following this plan? Lifestyle  Set weight loss goals with the help of your health care team. It is recommended that most people with prediabetes lose 7% of their current body weight.  Exercise for at least 30 minutes at least 5 days a week.  Attend a support group or seek ongoing support from a mental health counselor.  Take over-the-counter and prescription medicines only as told by your health care provider. Reading food labels  Read food labels to check the amount of fat, salt (sodium), and sugar in prepackaged foods. Avoid foods that have: ? Saturated fats. ? Trans fats. ? Added sugars.  Avoid foods that have more than 300 milligrams (mg) of sodium per serving. Limit your daily sodium intake to less than 2,300 mg each day. Shopping  Avoid buying pre-made and processed foods. Cooking  Cook with olive oil. Do not use butter, lard, or ghee.  Bake, broil, grill, or boil foods. Avoid frying. Meal planning   Work with your dietitian to develop an eating plan that is right for you. This may include: ? Tracking how many calories you take in. Use a food diary,  notebook, or mobile application to track what you eat at each meal. ? Using the glycemic index (GI) to plan your meals. The index tells you how quickly a food will raise your blood glucose. Choose low-GI foods. These foods take a longer time to raise blood glucose.  Consider following a Mediterranean diet. This diet  includes: ? Several servings each day of fresh fruits and vegetables. ? Eating fish at least twice a week. ? Several servings each day of whole grains, beans, nuts, and seeds. ? Using olive oil instead of other fats. ? Moderate alcohol consumption. ? Eating small amounts of red meat and whole-fat dairy.  If you have high blood pressure, you may need to limit your sodium intake or follow a diet such as the DASH eating plan. DASH is an eating plan that aims to lower high blood pressure. What foods are recommended? The items listed below may not be a complete list. Talk with your dietitian about what dietary choices are best for you. Grains Whole grains, such as whole-wheat or whole-grain breads, crackers, cereals, and pasta. Unsweetened oatmeal. Bulgur. Barley. Quinoa. Brown rice. Corn or whole-wheat flour tortillas or taco shells. Vegetables Lettuce. Spinach. Peas. Beets. Cauliflower. Cabbage. Broccoli. Carrots. Tomatoes. Squash. Eggplant. Herbs. Peppers. Onions. Cucumbers. Brussels sprouts. Fruits Berries. Bananas. Apples. Oranges. Grapes. Papaya. Mango. Pomegranate. Kiwi. Grapefruit. Cherries. Meats and other protein foods Seafood. Poultry without skin. Lean cuts of pork and beef. Tofu. Eggs. Nuts. Beans. Dairy Low-fat or fat-free dairy products, such as yogurt, cottage cheese, and cheese. Beverages Water. Tea. Coffee. Sugar-free or diet soda. Seltzer water. Lowfat or no-fat milk. Milk alternatives, such as soy or almond milk. Fats and oils Olive oil. Canola oil. Sunflower oil. Grapeseed oil. Avocado. Walnuts. Sweets and desserts Sugar-free or low-fat pudding. Sugar-free or low-fat ice cream and other frozen treats. Seasoning and other foods Herbs. Sodium-free spices. Mustard. Relish. Low-fat, low-sugar ketchup. Low-fat, low-sugar barbecue sauce. Low-fat or fat-free mayonnaise. What foods are not recommended? The items listed below may not be a complete list. Talk with your dietitian  about what dietary choices are best for you. Grains Refined white flour and flour products, such as bread, pasta, snack foods, and cereals. Vegetables Canned vegetables. Frozen vegetables with butter or cream sauce. Fruits Fruits canned with syrup. Meats and other protein foods Fatty cuts of meat. Poultry with skin. Breaded or fried meat. Processed meats. Dairy Full-fat yogurt, cheese, or milk. Beverages Sweetened drinks, such as sweet iced tea and soda. Fats and oils Butter. Lard. Ghee. Sweets and desserts Baked goods, such as cake, cupcakes, pastries, cookies, and cheesecake. Seasoning and other foods Spice mixes with added salt. Ketchup. Barbecue sauce. Mayonnaise. Summary  To prevent diabetes from developing, you may need to make diet and other lifestyle changes to help control blood sugar, improve cholesterol levels, and manage your blood pressure.  Set weight loss goals with the help of your health care team. It is recommended that most people with prediabetes lose 7 percent of their current body weight.  Consider following a Mediterranean diet that includes plenty of fresh fruits and vegetables, whole grains, beans, nuts, seeds, fish, lean meat, low-fat dairy, and healthy oils. This information is not intended to replace advice given to you by your health care provider. Make sure you discuss any questions you have with your health care provider. Document Revised: 02/09/2019 Document Reviewed: 12/22/2016 Elsevier Patient Education  2020 [employer] Inc.    High Cholesterol  High cholesterol is a condition in which the  blood has high levels of a white, waxy, fat-like substance (cholesterol). The human body needs small amounts of cholesterol. The liver makes all the cholesterol that the body needs. Extra (excess) cholesterol comes from the food that we eat. Cholesterol is carried from the liver by the blood through the blood vessels. If you have high cholesterol, deposits  (plaques) may build up on the walls of your blood vessels (arteries). Plaques make the arteries narrower and stiffer. Cholesterol plaques increase your risk for heart attack and stroke. Work with your health care provider to keep your cholesterol levels in a healthy range. What increases the risk? This condition is more likely to develop in people who:  Eat foods that are high in animal fat (saturated fat) or cholesterol.  Are overweight.  Are not getting enough exercise.  Have a family history of high cholesterol. What are the signs or symptoms? There are no symptoms of this condition. How is this diagnosed? This condition may be diagnosed from the results of a blood test.  If you are older than age 4, your health care provider may check your cholesterol every 4-6 years.  You may be checked more often if you already have high cholesterol or other risk factors for heart disease. The blood test for cholesterol measures:  "Bad" cholesterol (LDL cholesterol). This is the main type of cholesterol that causes heart disease. The desired level for LDL is less than 100.  "Good" cholesterol (HDL cholesterol). This type helps to protect against heart disease by cleaning the arteries and carrying the LDL away. The desired level for HDL is 60 or higher.  Triglycerides. These are fats that the body can store or burn for energy. The desired number for triglycerides is lower than 150.  Total cholesterol. This is a measure of the total amount of cholesterol in your blood, including LDL cholesterol, HDL cholesterol, and triglycerides. A healthy number is less than 200. How is this treated? This condition is treated with diet changes, lifestyle changes, and medicines. Diet changes  This may include eating more whole grains, fruits, vegetables, nuts, and fish.  This may also include cutting back on red meat and foods that have a lot of added sugar. Lifestyle changes  Changes may include getting at  least 40 minutes of aerobic exercise 3 times a week. Aerobic exercises include walking, biking, and swimming. Aerobic exercise along with a healthy diet can help you maintain a healthy weight.  Changes may also include quitting smoking. Medicines  Medicines are usually given if diet and lifestyle changes have failed to reduce your cholesterol to healthy levels.  Your health care provider may prescribe a statin medicine. Statin medicines have been shown to reduce cholesterol, which can reduce the risk of heart disease. Follow these instructions at home: Eating and drinking If told by your health care provider:  Eat chicken (without skin), fish, veal, shellfish, ground [place] breast, and round or loin cuts of red meat.  Do not eat fried foods or fatty meats, such as hot dogs and salami.  Eat plenty of fruits, such as apples.  Eat plenty of vegetables, such as broccoli, potatoes, and carrots.  Eat beans, peas, and lentils.  Eat grains such as barley, rice, couscous, and bulgur wheat.  Eat pasta without cream sauces.  Use skim or nonfat milk, and eat low-fat or nonfat yogurt and cheeses.  Do not eat or drink whole milk, cream, ice cream, egg yolks, or hard cheeses.  Do not eat stick margarine or  tub margarines that contain trans fats (also called partially hydrogenated oils).  Do not eat saturated tropical oils, such as coconut oil and palm oil.  Do not eat cakes, cookies, crackers, or other baked goods that contain trans fats.  General instructions  Exercise as directed by your health care provider. Increase your activity level with activities such as gardening, walking, and taking the stairs.  Take over-the-counter and prescription medicines only as told by your health care provider.  Do not use any products that contain nicotine or tobacco, such as cigarettes and e-cigarettes. If you need help quitting, ask your health care provider.  Keep all follow-up visits as told by  your health care provider. This is important. Contact a health care provider if:  You are struggling to maintain a healthy diet or weight.  You need help to start on an exercise program.  You need help to stop smoking. Get help right away if:  You have chest pain.  You have trouble breathing. This information is not intended to replace advice given to you by your health care provider. Make sure you discuss any questions you have with your health care provider. Document Revised: 10/21/2017 Document Reviewed: 04/17/2016 Elsevier Patient Education  2020 [employer].  Cholesterol Content in Foods Cholesterol is a waxy, fat-like substance that helps to carry fat in the blood. The body needs cholesterol in small amounts, but too much cholesterol can cause damage to the arteries and heart. Most people should eat less than 200 milligrams (mg) of cholesterol a day. Foods with cholesterol  Cholesterol is found in animal-based foods, such as meat, seafood, and dairy. Generally, low-fat dairy and lean meats have less cholesterol than full-fat dairy and fatty meats. The milligrams of cholesterol per serving (mg per serving) of common cholesterol-containing foods are listed below. Meat and other proteins  Egg -- one large whole egg has 186 mg.  Veal shank -- 4 oz has 141 mg.  Lean ground [place] (93% lean) -- 4 oz has 118 mg.  Fat-trimmed lamb loin -- 4 oz has 106 mg.  Lean ground beef (90% lean) -- 4 oz has 100 mg.  Lobster -- 3.5 oz has 90 mg.  Pork loin chops -- 4 oz has 86 mg.  Canned salmon -- 3.5 oz has 83 mg.  Fat-trimmed beef top loin -- 4 oz has 78 mg.  Frankfurter -- 1 frank (3.5 oz) has 77 mg.  Crab -- 3.5 oz has 71 mg.  Roasted chicken without skin, white meat -- 4 oz has 66 mg.  Light bologna -- 2 oz has 45 mg.  Deli-cut [place] -- 2 oz has 31 mg.  Canned tuna -- 3.5 oz has 31 mg.  Tomasa Blase -- 1 oz has 29 mg.  Oysters and mussels (raw) -- 3.5 oz has 25  mg.  Mackerel -- 1 oz has 22 mg.  Trout -- 1 oz has 20 mg.  Pork sausage -- 1 link (1 oz) has 17 mg.  Salmon -- 1 oz has 16 mg.  Tilapia -- 1 oz has 14 mg. Dairy  Soft-serve ice cream --  cup (4 oz) has 103 mg.  Whole-milk yogurt -- 1 cup (8 oz) has 29 mg.  Cheddar cheese -- 1 oz has 28 mg.  American cheese -- 1 oz has 28 mg.  Whole milk -- 1 cup (8 oz) has 23 mg.  2% milk -- 1 cup (8 oz) has 18 mg.  Cream cheese -- 1 tablespoon (Tbsp) has 15  mg.  Cottage cheese --  cup (4 oz) has 14 mg.  Low-fat (1%) milk -- 1 cup (8 oz) has 10 mg.  Sour cream -- 1 Tbsp has 8.5 mg.  Low-fat yogurt -- 1 cup (8 oz) has 8 mg.  Nonfat Greek yogurt -- 1 cup (8 oz) has 7 mg.  Half-and-half cream -- 1 Tbsp has 5 mg. Fats and oils  Cod liver oil -- 1 tablespoon (Tbsp) has 82 mg.  Butter -- 1 Tbsp has 15 mg.  Lard -- 1 Tbsp has 14 mg.  Bacon grease -- 1 Tbsp has 14 mg.  Mayonnaise -- 1 Tbsp has 5-10 mg.  Margarine -- 1 Tbsp has 3-10 mg. Exact amounts of cholesterol in these foods may vary depending on specific ingredients and brands. Foods without cholesterol Most plant-based foods do not have cholesterol unless you combine them with a food that has cholesterol. Foods without cholesterol include:  Grains and cereals.  Vegetables.  Fruits.  Vegetable oils, such as olive, canola, and sunflower oil.  Legumes, such as peas, beans, and lentils.  Nuts and seeds.  Egg whites. Summary  The body needs cholesterol in small amounts, but too much cholesterol can cause damage to the arteries and heart.  Most people should eat less than 200 milligrams (mg) of cholesterol a day. This information is not intended to replace advice given to you by your health care provider. Make sure you discuss any questions you have with your health care provider. Document Revised: 09/30/2017 Document Reviewed: 06/14/2017 Elsevier Patient Education  [place].

## 2020-03-14 ENCOUNTER — Telehealth: Payer: Self-pay | Admitting: Internal Medicine

## 2020-03-14 DIAGNOSIS — E785 Hyperlipidemia, unspecified: Secondary | ICD-10-CM

## 2020-03-14 MED ORDER — EZETIMIBE 10 MG PO TABS: 10.0000 mg | ORAL_TABLET | Freq: Every day | ORAL | 3 refills | Status: DC

## 2020-03-14 NOTE — Telephone Encounter (Signed)
Pt would like refill of ezetimibe (ZETIA) 10 MG tablet sent to [facility] instead of [facility]

## 2020-03-26 MED ORDER — EZETIMIBE 10 MG PO TABS: 10.0000 mg | ORAL_TABLET | Freq: Every day | ORAL | 3 refills | Status: DC

## 2020-03-26 NOTE — Addendum Note (Signed)
Addended byElise Benne T on: 03/26/2020 02:47 PM   Modules accepted: Orders

## 2020-03-26 NOTE — Telephone Encounter (Signed)
Pt called the rx is not at [facility] and needs it sent there

## 2020-05-06 ENCOUNTER — Ambulatory Visit (INDEPENDENT_AMBULATORY_CARE_PROVIDER_SITE_OTHER): Payer: Medicare HMO | Admitting: Dermatology

## 2020-05-06 ENCOUNTER — Other Ambulatory Visit: Payer: Self-pay

## 2020-05-06 DIAGNOSIS — L281 Prurigo nodularis: Secondary | ICD-10-CM

## 2020-05-06 DIAGNOSIS — L918 Other hypertrophic disorders of the skin: Secondary | ICD-10-CM

## 2020-05-06 MED ORDER — CLOBETASOL PROPIONATE 0.05 % EX OINT: 1.0000 "application " | TOPICAL_OINTMENT | Freq: Two times a day (BID) | CUTANEOUS | 0 refills | Status: DC

## 2020-05-06 NOTE — Progress Notes (Signed)
   New Patient Visit  Subjective  Scott Villanueva is a 59 y.o. male who presents for the following: Skin Tag (bil axilla, neck, irritating, painful, has to use a crutch periodically and get irritiated by crutch) and callus (L hand, 2 yrs, pt picks at)  The following portions of the chart were reviewed this encounter and updated as appropriate:      Review of Systems:  No other skin or systemic complaints except as noted in HPI or Assessment and Plan.  Objective  Well appearing patient in no apparent distress; mood and affect are within normal limits.  A focused examination was performed including bil axilla, neck. Relevant physical exam findings are noted in the Assessment and Plan.  Objective  L axilla x 2, R axilla x 5, R neck x 12, L neck x 3 (22): Fleshy, skin-colored pedunculated papules.    Images     Assessment & Plan    Skin tag (22) L axilla x 2, R axilla x 5, R neck x 12, L neck x 3  Irritated/Painful  ABN signed and scanned in chart Snip excision x 22 performed today L axilla x 2 R axilla x 5 R neck x 12 L neck x 3  The areas were prepped with isopropyl alcohol. A small amount of lidocaine 1% with epinephrine was injected at the base of each lesion to achieve good local anesthesia. The skin tags were removed using a snip technique. Aluminum chloride and ED was used for hemostasis. Petrolatum was applied. The procedure was tolerated well. - Wound care was reviewed with the patient. They were advised to call with any concerns. 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  I, Ardis Rowan, RMA, am acting as scribe for Willeen Niece, MD  .  Documentation: I have reviewed the above documentation for accuracy and completeness, and I agree with the above.  Willeen Niece MD

## 2020-05-06 NOTE — Patient Instructions (Addendum)
    Wound Care Instructions  1. Cleanse wound gently with soap and water once a day then pat dry with clean gauze. Apply a thing coat of Petrolatum (petroleum jelly, "Vaseline") over the wound (unless you have an allergy to this). We recommend that you use a new, sterile tube of Vaseline. Do not pick or remove scabs. Do not remove the yellow or white "healing tissue" from the base of the wound.  2. Cover the wound with fresh, clean, nonstick gauze and secure with paper tape. You may use Band-Aids in place of gauze and tape if the would is small enough, but would recommend trimming much of the tape off as there is often too much. Sometimes Band-Aids can irritate the skin.  3. You should call the office for your biopsy report after 1 week if you have not already been contacted.  4. If you experience any problems, such as abnormal amounts of bleeding, swelling, significant bruising, significant pain, or evidence of infection, please call the office immediately.  5. FOR ADULT SURGERY PATIENTS: If you need something for pain relief you may take 1 extra strength Tylenol (acetaminophen) AND 2 Ibuprofen (200mg  each) together every 4 hours as needed for pain. (do not take these if you are allergic to them or if you have a reason you should not take them.) Typically, you may only need pain medication for 1 to 3 days.       For Callus  Start Eucerin roughness relief and then apply clobetasol ointment and cover with Band-Aid, do this 2 times a day until clear

## 2020-07-17 ENCOUNTER — Ambulatory Visit: Payer: Medicare HMO | Admitting: Internal Medicine

## 2020-08-05 DIAGNOSIS — R69 Illness, unspecified: Secondary | ICD-10-CM | POA: Diagnosis not present

## 2020-09-10 ENCOUNTER — Telehealth: Payer: Self-pay | Admitting: Internal Medicine

## 2020-09-10 ENCOUNTER — Encounter: Payer: Self-pay | Admitting: Internal Medicine

## 2020-09-10 NOTE — Telephone Encounter (Signed)
Patient needs a refill on his clopidogrel (PLAVIX) 75 MG tablet. Patient wanted to let Dr. Shirlee Latch know that [facility] that they would be faxing over a paper for her to sign to get his cpap supplies.

## 2020-09-11 ENCOUNTER — Other Ambulatory Visit: Payer: Self-pay | Admitting: Internal Medicine

## 2020-09-11 DIAGNOSIS — I2510 Atherosclerotic heart disease of native coronary artery without angina pectoris: Secondary | ICD-10-CM

## 2020-09-11 MED ORDER — CLOPIDOGREL BISULFATE 75 MG PO TABS: 75.0000 mg | ORAL_TABLET | Freq: Every day | ORAL | 1 refills | Status: DC

## 2020-09-11 NOTE — Telephone Encounter (Signed)
Further refills cardiology Dr. Algis Downs is he still seeing him?

## 2020-09-11 NOTE — Telephone Encounter (Signed)
Left message informing the patient of the below. Informed to call back in with any questions

## 2020-09-11 NOTE — Telephone Encounter (Signed)
Okay to fill medication? This medication is not under your name.

## 2020-09-16 DIAGNOSIS — R69 Illness, unspecified: Secondary | ICD-10-CM | POA: Diagnosis not present

## 2020-09-17 DIAGNOSIS — G4733 Obstructive sleep apnea (adult) (pediatric): Secondary | ICD-10-CM | POA: Diagnosis not present

## 2020-09-22 ENCOUNTER — Encounter: Payer: Self-pay | Admitting: Internal Medicine

## 2020-09-22 DIAGNOSIS — G4733 Obstructive sleep apnea (adult) (pediatric): Secondary | ICD-10-CM | POA: Insufficient documentation

## 2020-09-22 NOTE — Addendum Note (Signed)
Addended by: Quentin Ore on: 09/22/2020 08:16 PM   Modules accepted: Orders

## 2020-09-24 NOTE — Telephone Encounter (Signed)
Faxed  recertification for refill or new supply recertification for PAP supplies faxed on 09-23-2020

## 2020-10-01 DIAGNOSIS — G4733 Obstructive sleep apnea (adult) (pediatric): Secondary | ICD-10-CM | POA: Diagnosis not present

## 2020-10-01 DIAGNOSIS — R69 Illness, unspecified: Secondary | ICD-10-CM | POA: Diagnosis not present

## 2020-10-01 DIAGNOSIS — G4730 Sleep apnea, unspecified: Secondary | ICD-10-CM | POA: Diagnosis not present

## 2020-10-03 DIAGNOSIS — G4733 Obstructive sleep apnea (adult) (pediatric): Secondary | ICD-10-CM | POA: Diagnosis not present

## 2020-10-22 ENCOUNTER — Other Ambulatory Visit: Payer: Self-pay

## 2020-10-22 ENCOUNTER — Encounter: Payer: Self-pay | Admitting: Internal Medicine

## 2020-10-22 ENCOUNTER — Ambulatory Visit (INDEPENDENT_AMBULATORY_CARE_PROVIDER_SITE_OTHER): Payer: Medicare HMO | Admitting: Internal Medicine

## 2020-10-22 VITALS — BP 150/100 | HR 78 | Temp 98.8°F | Ht 70.0 in | Wt 310.0 lb

## 2020-10-22 DIAGNOSIS — M5441 Lumbago with sciatica, right side: Secondary | ICD-10-CM

## 2020-10-22 DIAGNOSIS — E785 Hyperlipidemia, unspecified: Secondary | ICD-10-CM | POA: Diagnosis not present

## 2020-10-22 DIAGNOSIS — R7303 Prediabetes: Secondary | ICD-10-CM

## 2020-10-22 DIAGNOSIS — M48061 Spinal stenosis, lumbar region without neurogenic claudication: Secondary | ICD-10-CM | POA: Diagnosis not present

## 2020-10-22 DIAGNOSIS — M25551 Pain in right hip: Secondary | ICD-10-CM | POA: Diagnosis not present

## 2020-10-22 DIAGNOSIS — M87051 Idiopathic aseptic necrosis of right femur: Secondary | ICD-10-CM

## 2020-10-22 DIAGNOSIS — Z1329 Encounter for screening for other suspected endocrine disorder: Secondary | ICD-10-CM

## 2020-10-22 DIAGNOSIS — R269 Unspecified abnormalities of gait and mobility: Secondary | ICD-10-CM

## 2020-10-22 DIAGNOSIS — M47816 Spondylosis without myelopathy or radiculopathy, lumbar region: Secondary | ICD-10-CM | POA: Diagnosis not present

## 2020-10-22 DIAGNOSIS — M79651 Pain in right thigh: Secondary | ICD-10-CM

## 2020-10-22 DIAGNOSIS — M87052 Idiopathic aseptic necrosis of left femur: Secondary | ICD-10-CM

## 2020-10-22 DIAGNOSIS — Z125 Encounter for screening for malignant neoplasm of prostate: Secondary | ICD-10-CM

## 2020-10-22 DIAGNOSIS — I2510 Atherosclerotic heart disease of native coronary artery without angina pectoris: Secondary | ICD-10-CM

## 2020-10-22 DIAGNOSIS — Z6841 Body Mass Index (BMI) 40.0 and over, adult: Secondary | ICD-10-CM

## 2020-10-22 DIAGNOSIS — Z1389 Encounter for screening for other disorder: Secondary | ICD-10-CM

## 2020-10-22 DIAGNOSIS — I10 Essential (primary) hypertension: Secondary | ICD-10-CM | POA: Diagnosis not present

## 2020-10-22 DIAGNOSIS — G8929 Other chronic pain: Secondary | ICD-10-CM | POA: Insufficient documentation

## 2020-10-22 MED ORDER — CLOPIDOGREL BISULFATE 75 MG PO TABS: 75.0000 mg | ORAL_TABLET | Freq: Every day | ORAL | 3 refills | Status: DC

## 2020-10-22 MED ORDER — LOSARTAN POTASSIUM 50 MG PO TABS: 50.0000 mg | ORAL_TABLET | Freq: Every day | ORAL | 3 refills | Status: DC

## 2020-10-22 MED ORDER — ASPIRIN EC 81 MG PO TBEC: 81.0000 mg | DELAYED_RELEASE_TABLET | Freq: Every day | ORAL | 3 refills | Status: DC

## 2020-10-22 MED ORDER — METOPROLOL SUCCINATE ER 25 MG PO TB24: 25.0000 mg | ORAL_TABLET | Freq: Every day | ORAL | 3 refills | Status: DC

## 2020-10-22 MED ORDER — OXYCODONE-ACETAMINOPHEN 7.5-325 MG PO TABS: 1.0000 | ORAL_TABLET | Freq: Two times a day (BID) | ORAL | 0 refills | Status: DC | PRN

## 2020-10-22 MED ORDER — ALBUTEROL SULFATE 0.63 MG/3ML IN NEBU: 1.0000 | INHALATION_SOLUTION | Freq: Four times a day (QID) | RESPIRATORY_TRACT | 11 refills | Status: DC | PRN

## 2020-10-22 MED ORDER — ATORVASTATIN CALCIUM 40 MG PO TABS: 40.0000 mg | ORAL_TABLET | Freq: Every day | ORAL | 3 refills | Status: DC

## 2020-10-22 MED ORDER — AMLODIPINE BESYLATE 10 MG PO TABS: 10.0000 mg | ORAL_TABLET | Freq: Every day | ORAL | 3 refills | Status: DC

## 2020-10-22 NOTE — Progress Notes (Signed)
Chief Complaint  Patient presents with  . Pain Management   F/u  1. Chronic pain b/l hips R>left AVN right hip pt thinks 2/2 prior steroid inj, [facility] in [place] he is in 5/10 pain he would like to see pain clinic and ortho but considering options for tx AVN. Sitting down and getting back up is hard and walking a short distance he uses 1 crutch and has 30 month old grandson and is afraid he will drop him walking a short distance. Pain radiations to right thigh and tries a massage gun. Carrying heavy loads on the back of him is on and heavy loads loads on the front causes right leg weakness and worsening sxs or turning at hips lateral rotation left and right aggravates his sx's. He would like to be more active   MRI right hip 10/1/202  1. Avascular necrosis in the right femoral head with mild reactive edema in the head/neck, and a moderate joint effusion.  2. Small focus of chronic avascular necrosis in the left femoral head, without associated edema or effusion.     04/20/2019 MRI L spine Moderately severe facet joint arthritis L4-5 with a facet joint effusion on the left. Mild spinal stenosis L4-5.  Moderate facet joint arthritis L5-S1.    2. HTN/CAD s/p stent stopped taking BP meds 2-3 months ago norvac 10 mg qd, toprol 25 mg xl and lipitor 40 and losartan 25 mg qd encouraged he needs to resume taking these and f/u cards Dr. Algie Coffer   Review of Systems  Constitutional: Negative for weight loss.  HENT: Negative for hearing loss.   Eyes: Negative for blurred vision.  Respiratory: Positive for shortness of breath.   Cardiovascular: Negative for chest pain.  Gastrointestinal: Negative for abdominal pain.  Musculoskeletal: Positive for joint pain. Negative for falls.  Skin: Negative for rash.  Neurological: Negative for headaches.  Psychiatric/Behavioral: Negative for depression.   Past Medical History:  Diagnosis Date  . Anginal pain (HCC)   . CAD (coronary artery disease)    s/p  stent Dr. Algie Coffer; in 40s stents  . Difficult intubation   . Diverticulitis   . Dysrhythmia   . HLD (hyperlipidemia)   . Hypertension   . Myocardial infarction (HCC)     Feb.2008  . Obesity   . Pneumothorax 2006  . Sleep apnea    CPAP   Past Surgical History:  Procedure Laterality Date  . CARDIAC CATHETERIZATION     with stents  . PLEURAL SCARIFICATION Left 2006  . SHOULDER ARTHROSCOPY WITH OPEN ROTATOR CUFF REPAIR Left 10/12/2017   Procedure: SHOULDER ARTHROSCOPY WITH OPEN ROTATOR CUFF REPAIR;  Surgeon: Deeann Saint, MD;  Location: [facility] ORS;  Service: Orthopedics;  Laterality: Left;  Left shoulder arthroscopy, subaccromial decompression, distal clavical resection and biceps tenotomy  . shoulder joint replacement Left    surgery x 2 Dr. Andrey Campanile ortho  . TONSILLECTOMY     Family History  Problem Relation Age of Onset  . Depression Mother   . Anemia Mother   . Thyroid disease Mother   . Huntington's disease Mother   . CAD Father        s/p bypass   . Obesity Father   . Huntington's disease Sister   . Depression Sister   . Schizophrenia Brother   . Insomnia Brother   . Colon cancer Neg Hx   . Esophageal cancer Neg Hx   . Pancreatic cancer Neg Hx   . Stomach cancer Neg Hx   .  Liver disease Neg Hx    Social History   Socioeconomic History  . Marital status: Married    Spouse name: Not on file  . Number of children: Not on file  . Years of education: Not on file  . Highest education level: Not on file  Occupational History  . Not on file  Tobacco Use  . Smoking status: Current Some Day Smoker    Types: Cigarettes    Last attempt to quit: 10/07/2010    Years since quitting: 10.0  . Smokeless tobacco: Never Used  Vaping Use  . Vaping Use: Never used  Substance and Sexual Activity  . Alcohol use: Yes    Alcohol/week: 6.0 standard drinks    Types: 3 Glasses of wine, 3 Cans of beer per week    Comment: daily  . Drug use: No  . Sexual activity: Yes    Partners:  Male  Other Topics Concern  . 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 Cholecalciferol (VITAMIN D3) 50 MCG (2000 UT) TABS Take 6,000 tablets by mouth.  . Ginkgo Biloba 40 MG TABS Take 1 tablet by mouth daily.  . Multiple Vitamin (MULTIVITAMIN WITH MINERALS) TABS tablet Take 1 tablet by mouth daily.  . Omega-3 Fatty Acids (FISH OIL PO) Take by mouth.  . [DISCONTINUED] albuterol (ACCUNEB) 0.63 MG/3ML nebulizer solution Take 1 ampule by nebulization every 6 (six) hours as needed for wheezing.  . [DISCONTINUED] aspirin EC 81 MG tablet Take 81 mg by mouth daily.  . [DISCONTINUED] clopidogrel (PLAVIX) 75 MG tablet Take 1 tablet (75 mg total) by mouth daily. Further refills Dr. Algie Coffer  . [DISCONTINUED] losartan (COZAAR) 25 MG tablet Take 1 tablet (25 mg total) by mouth daily.   Allergies  Allergen Reactions  . No Known Allergies    No results found for this or any previous visit (from the past 2160 hour(s)). Objective  Body mass index is 44.48 kg/m. Wt Readings from Last 3 Encounters:  10/22/20 (!) 310 lb (140.6 kg)  03/12/20 (!) 308 lb 6.4 oz (139.9 kg)  12/18/19 (!) 317 lb 3.2 oz (143.9 kg)   Temp Readings from Last 3 Encounters:  10/22/20 98.8 F (37.1 C) (Oral)  03/12/20 98.5 F (36.9 C) (Oral)  12/18/19 98.5 F (36.9 C)   BP Readings from Last 3 Encounters:  10/22/20 (!) 150/100  03/12/20 (!) 142/82  01/15/20 (!) 146/92   Pulse Readings from Last 3 Encounters:  10/22/20 78   03/12/20 92  01/15/20 73    Physical Exam Vitals and nursing note reviewed.  Constitutional:      Appearance: Normal appearance. He is well-developed and well-groomed. He is morbidly obese.  HENT:     Head: Normocephalic and atraumatic.  Eyes:     Conjunctiva/sclera: Conjunctivae normal.     Pupils: Pupils are equal, round, and reactive to light.  Cardiovascular:     Rate  and Rhythm: Normal rate and regular rhythm.     Heart sounds: Normal heart sounds. No murmur heard.   Pulmonary:     Effort: Pulmonary effort is normal.     Breath sounds: Normal breath sounds.  Skin:    General: Skin is warm and dry.  Neurological:     General: No focal deficit present.     Mental Status: He is alert and oriented to person, place, and time. Mental status is at baseline.     Gait: Gait normal.  Psychiatric:        Attention and Perception: Attention and perception normal.        Mood and Affect: Mood and affect normal.        Speech: Speech normal.        Behavior: Behavior normal. Behavior is cooperative.        Thought Content: Thought content normal.        Cognition and Memory: Cognition and memory normal.        Judgment: Judgment normal.     Assessment  Plan  Avascular necrosis of bones of both hips (HCC) Avascular necrosis of bone of hip, right (HCC) - Plan: oxyCODONE-acetaminophen (PERCOCET) 7.5-325 MG tablet  Spinal stenosis of lumbar region, unspecified whether neurogenic claudication present Chronic right-sided low back pain with right-sided sciatica - Plan: oxyCODONE-acetaminophen (PERCOCET) 7.5-325 MG tablet Right hip pain - Plan: oxyCODONE-acetaminophen (PERCOCET) 7.5-325 MG tablet Right thigh pain - Plan: oxyCODONE-acetaminophen (PERCOCET) 7.5-325 MG tablet Arthritis of facet joint of lumbar spine - Plan: oxyCODONE-acetaminophen (PERCOCET) 7.5-325 MG tablet bid prn   HTN/HLD/Coronary artery disease involving native coronary artery of native heart without angina  pectoris - Plan: atorvastatin (LIPITOR) 40 MG tablet, clopidogrel (PLAVIX) 75 MG tablet, aspirin EC 81 MG tablet, toprol xl 25 mg qd  metoprolol succinate (TOPROL-XL) 25 MG 24 hr tablet, losartan (COZAAR) 50 MG tablet, amLODipine (NORVASC) 10 MG tablet Titrate meds:  ake losartan 25 1/2 pill x 3 days then increase to 1 pill daily on day 4  Take toprol xl 25 mg daily at night starting now  Take norvasc 10 mg starting day 7 Take lipitor at night   Goal <130/<80 if not then my chart me     Morbid obesity with BMI of 40.0-44.9, adult (HCC)  rec healthy diet and exercise   HM Flu shot had utd  Tdap utd   Consider shingrix vaccine  covid 19 vx 2/2 utd pfizer 10/19/20   normal PSA 01/15/20 Colonoscopy referred leb Gi Dr. Anselm Jungling referred 12/2019 pt never scheduled given # to call for future  Pt declines today 10/22/20 and wants cologuard orderd today  Skin referred derm previously Smoker since age 7 y.o light per pt always <1 ppd on and off 1 pk lasting 1-3 months x 45 years on and off  Consider testing for HD in future mother died of HD, 1/2 has gene as well  Cardiology Dr. Algie Coffer   rec healthy diet and exercise   Provider: Dr. French Ana McLean-Scocuzza-Internal Medicine

## 2020-10-22 NOTE — Patient Instructions (Addendum)
Consider ortho at [facility] Dr. Corky Downs  Dr. Luanna Cole [phone] [address] [place] [address]   Pain clinic Dr. Cherylann Ratel at [facility] pain clinic     Take losartan 25 1/2 pill x 3 days then increase to 1 pill daily on day 4  Take toprol xl 25 mg daily at night starting now  Take norvasc 10 mg starting day 7 Take lipitor at night   Goal <130/<80 if not then my chart me    Spinal Stenosis  Spinal stenosis occurs when the open space (spinal canal) between the bones of your spine (vertebrae) narrows, putting pressure on the spinal cord or nerves. What are the causes? This condition is caused by areas of bone pushing into the central canals of your vertebrae. This condition may be present at birth (congenital), or it may be caused by:  Arthritic deterioration of your vertebrae (spinal degeneration). This usually starts around age 57.  Injury or trauma to the spine.  Tumors in the spine.  Calcium deposits in the spine. What are the signs or symptoms? Symptoms of this condition include:  Pain in the neck or back that is generally worse with activities, particularly when standing and walking.  Numbness, tingling, hot or cold sensations, weakness, or weariness in your legs.  Pain going up and down the leg (sciatica).  Frequent episodes of falling.  A foot-slapping gait that leads to muscle weakness. In more serious cases, you may develop:  Problemspassing stool or passing urine.  Difficulty having sex.  Loss of feeling in part or all of your leg. Symptoms may come on slowly and get worse over time. How is this diagnosed? This condition is diagnosed based on your medical history and a physical exam. Tests will also be done, such as:  MRI.  CT scan.  X-ray. How is this treated? Treatment for this condition often focuses on managing your pain and any other symptoms. Treatment may include:  Practicing good posture to lessen pressure on your nerves.  Exercising to  strengthen muscles, build endurance, improve balance, and maintain good joint movement (range of motion).  Losing weight, if needed.  Taking medicines to reduce swelling, inflammation, or pain.  Assistive devices, such as a corset or brace. In some cases, surgery may be needed. The most common procedure is decompression laminectomy. This is done to remove excess bone that puts pressure on your nerve roots. Follow these instructions at home: Managing pain, stiffness, and swelling  Do all exercises and stretches as told by your health care provider.  Practice good posture. If you were given a brace or a corset, wear it as told by your health care provider.  Do not do any activities that cause pain. Ask your health care provider what activities are safe for you.  Do not lift anything that is heavier than 10 lb (4.5 kg) or the limit that your health care provider tells you.  Maintain a healthy weight. Talk with your health care provider if you need help losing weight.  If directed, apply heat to the affected area as often as told by your health care provider. Use the heat source that your health care provider recommends, such as a moist heat pack or a heating pad. ? Place a towel between your skin and the heat source. ? Leave the heat on for 20-30 minutes. ? Remove the heat if your skin turns bright red. This is especially important if you are not able to feel pain, heat,  or cold. You may have a greater risk of getting burned. General instructions  Take over-the-counter and prescription medicines only as told by your health care provider.  Do not use any products that contain nicotine or tobacco, such as cigarettes and e-cigarettes. If you need help quitting, ask your health care provider.  Eat a healthy diet. This includes plenty of fruits and vegetables, whole grains, and low-fat (lean) protein.  Keep all follow-up visits as told by your health care provider. This is  important. Contact a health care provider if:  Your symptoms do not get better or they get worse.  You have a fever. Get help right away if:  You have new or worse pain in your neck or upper back.  You have severe pain that cannot be controlled with medicines.  You are dizzy.  You have vision problems, blurred vision, or double vision.  You have a severe headache that is worse when you stand.  You have nausea or you vomit.  You develop new or worse numbness or tingling in your back or legs.  You have pain, redness, swelling, or warmth in your arm or leg. Summary  Spinal stenosis occurs when the open space (spinal canal) between the bones of your spine (vertebrae) narrows. This narrowing puts pressure on the spinal cord or nerves.  Spinal stenosis can cause numbness, weakness, or pain in the neck, back, and legs.  This condition may be caused by a birth defect, arthritic deterioration of your vertebrae, injury, tumors, or calcium deposits.  This condition is usually diagnosed with MRIs, CT scans, and X-rays. This information is not intended to replace advice given to you by your health care provider. Make sure you discuss any questions you have with your health care provider. Document Revised: 09/30/2017 Document Reviewed: 09/22/2016 Elsevier Patient Education  2020 Elsevier Inc.  Avascular Necrosis Avascular necrosis is death of bone tissue due to lack of blood supply. This condition may also be called:  Osteonecrosis.  Aseptic necrosis.  Ischemic bone necrosis. Without proper blood supply, the internal layer of the affected bone dies. Over time, small breaks form in the outer layer of the bone. If this process affects a bone near a joint, the joint may collapse or move out of place (become dislocated). Joints that may be affected include the jaw, wrist, fingers, knee, and foot. Avascular necrosis most commonly affects:  The hip joint, especially the top of the thigh  bone (femoral head).  The top of the upper arm bone (humeral head). What are the causes? This condition may be caused by:  Damage or injury to a bone or joint.  Using steroid medicine such as prednisone for a long time.  Changes in the body's disease-fighting system (immune system).  Changes in the body's system of chemicals that regulate body processes (hormones).  A lot of exposure to radiation. What increases the risk? The following factors may make you more likely to develop this condition:  Alcohol abuse.  A history of joint injury.  Long-term or frequent use of steroid medicines.  Having a medical condition such as: ? HIV or AIDS. ? Diabetes. ? Sickle cell disease. ? A disease in which your body's immune system attacks your body's own healthy tissues (autoimmune disease). What are the signs or symptoms? The main symptoms of avascular necrosis are:  Pain. If an affected joint collapses, the pain may suddenly get severe.  Decreased ability to move the affected bone or joint. How is this diagnosed? Avascular  necrosis may be diagnosed based on:  Your symptoms and medical history.  A physical exam.  Imaging tests, such as X-rays, bone scans, and an MRI. How is this treated? Treatment for this condition may include:  Pain medicine, such as NSAIDs.  Medicine to improve bone growth.  Avoiding placing any pressure or weight on the affected area. If avascular necrosis occurs in your hip, ankle, or foot, you may need to use a device to help you move around (assistive device), such as crutches or a rolling scooter.  Physical therapy to help regain strength and motion in the affected area.  Surgery, such as: ? Core decompression. In this surgery, one or more holes are placed in the bone for new blood vessels to grow into. This provides a renewed blood supply to the bone. This surgery may reduce pain and pressure in the affected bone and slow the destruction of bones  and joints. ? Osteotomy. In this surgery, the bone is reshaped to reduce stress on the affected area of the joint. ? Bone grafting. In this surgery, healthy bone from a different part of your body is used to replace damaged bone. ? Total joint replacement (arthroplasty). In this surgery, the affected surfaces of bone on one or both sides of a joint are replaced with artificial parts (prostheses).  Electrical stimulation (also called e-stim). During this procedure, a probe over the skin sends shock waves into the body. This may help to encourage new bone growth.  High-pressure oxygen therapy (hyperbaric oxygen). This is rarely used. Follow these instructions at home: Activity  Ask your health care provider what activities are safe for you.  If physical therapy was prescribed, do exercises as told by your health care provider.  Avoid placing any pressure or weight on the affected area, as told by your health care provider. Use assistive devices as instructed. Managing pain, stiffness, and swelling  If directed, apply heat to the affected area as often as told by your health care provider. Heat can reduce the stiffness of your muscles and joints. Use the heat source that your health care provider recommends, such as a moist heat pack or a heating pad. ? Place a towel between your skin and the heat source. ? Leave the heat on for 20-30 minutes. ? Remove the heat if your skin turns bright red. This is especially important if you are unable to feel pain, heat, or cold. You may have a greater risk of getting burned.  If directed, put ice on affected areas. Icing can help to relieve joint pain and swelling. ? Put ice in a plastic bag. ? Place a towel between your skin and the bag. ? Leave the ice on for 20 minutes, 2-3 times a day. General instructions   Take over-the-counter and prescription medicines only as told by your health care provider.  Do not drive or use heavy machinery while  taking prescription pain medicine.  If a bone in your arm or leg is affected, ask your health care provider if it is safe for you to drive.  Do not use any products that contain nicotine or tobacco, such as cigarettes and e-cigarettes. These can delay bone healing. If you need help quitting, ask your health care provider.  Do not drink alcohol.  Keep all follow-up visits as told by your health care provider. This is important. Contact a health care provider if:  You have pain that gets worse or does not get better with medicine.  Your ability  to move your joint gets worse. Get help right away if:  Your pain suddenly becomes severe. Summary  Avascular necrosis is death of bone tissue due to a lack of blood supply.  Without proper blood supply, the internal layer of the affected bone dies. Over time, small breaks form in the outer layer of the bone.  Avoid putting any pressure or weight on the affected area. You may need to use a device to help you move around (assistive device), such as crutches or a rolling scooter. This information is not intended to replace advice given to you by your health care provider. Make sure you discuss any questions you have with your health care provider. Document Revised: 09/30/2017 Document Reviewed: 09/27/2017 Elsevier Patient Education  2020 Elsevier Inc.  DASH Eating Plan DASH stands for "Dietary Approaches to Stop Hypertension." The DASH eating plan is a healthy eating plan that has been shown to reduce high blood pressure (hypertension). It may also reduce your risk for type 2 diabetes, heart disease, and stroke. The DASH eating plan may also help with weight loss. What are tips for following this plan?  General guidelines  Avoid eating more than 2,300 mg (milligrams) of salt (sodium) a day. If you have hypertension, you may need to reduce your sodium intake to 1,500 mg a day.  Limit alcohol intake to no more than 1 drink a day for nonpregnant  women and 2 drinks a day for men. One drink equals 12 oz of beer, 5 oz of wine, or 1 oz of hard liquor.  Work with your health care provider to maintain a healthy body weight or to lose weight. Ask what an ideal weight is for you.  Get at least 30 minutes of exercise that causes your heart to beat faster (aerobic exercise) most days of the week. Activities may include walking, swimming, or biking.  Work with your health care provider or diet and nutrition specialist (dietitian) to adjust your eating plan to your individual calorie needs. Reading food labels   Check food labels for the amount of sodium per serving. Choose foods with less than 5 percent of the Daily Value of sodium. Generally, foods with less than 300 mg of sodium per serving fit into this eating plan.  To find whole grains, look for the word "whole" as the first word in the ingredient list. Shopping  Buy products labeled as "low-sodium" or "no salt added."  Buy fresh foods. Avoid canned foods and premade or frozen meals. Cooking  Avoid adding salt when cooking. Use salt-free seasonings or herbs instead of table salt or sea salt. Check with your health care provider or pharmacist before using salt substitutes.  Do not fry foods. Cook foods using healthy methods such as baking, boiling, grilling, and broiling instead.  Cook with heart-healthy oils, such as olive, canola, soybean, or sunflower oil. Meal planning  Eat a balanced diet that includes: ? 5 or more servings of fruits and vegetables each day. At each meal, try to fill half of your plate with fruits and vegetables. ? Up to 6-8 servings of whole grains each day. ? Less than 6 oz of lean meat, poultry, or fish each day. A 3-oz serving of meat is about the same size as a deck of cards. One egg equals 1 oz. ? 2 servings of low-fat dairy each day. ? A serving of nuts, seeds, or beans 5 times each week. ? Heart-healthy fats. Healthy fats called Omega-3 fatty acids  are found  in foods such as flaxseeds and coldwater fish, like sardines, salmon, and mackerel.  Limit how much you eat of the following: ? Canned or prepackaged foods. ? Food that is high in trans fat, such as fried foods. ? Food that is high in saturated fat, such as fatty meat. ? Sweets, desserts, sugary drinks, and other foods with added sugar. ? Full-fat dairy products.  Do not salt foods before eating.  Try to eat at least 2 vegetarian meals each week.  Eat more home-cooked food and less restaurant, buffet, and fast food.  When eating at a restaurant, ask that your food be prepared with less salt or no salt, if possible. What foods are recommended? The items listed may not be a complete list. Talk with your dietitian about what dietary choices are best for you. Grains Whole-grain or whole-wheat bread. Whole-grain or whole-wheat pasta. Brown rice. Orpah Cobb. Bulgur. Whole-grain and low-sodium cereals. Pita bread. Low-fat, low-sodium crackers. Whole-wheat flour tortillas. Vegetables Fresh or frozen vegetables (raw, steamed, roasted, or grilled). Low-sodium or reduced-sodium tomato and vegetable juice. Low-sodium or reduced-sodium tomato sauce and tomato paste. Low-sodium or reduced-sodium canned vegetables. Fruits All fresh, dried, or frozen fruit. Canned fruit in natural juice (without added sugar). Meat and other protein foods Skinless chicken or [place]. Ground chicken or [place]. Pork with fat trimmed off. Fish and seafood. Egg whites. Dried beans, peas, or lentils. Unsalted nuts, nut butters, and seeds. Unsalted canned beans. Lean cuts of beef with fat trimmed off. Low-sodium, lean deli meat. Dairy Low-fat (1%) or fat-free (skim) milk. Fat-free, low-fat, or reduced-fat cheeses. Nonfat, low-sodium ricotta or cottage cheese. Low-fat or nonfat yogurt. Low-fat, low-sodium cheese. Fats and oils Soft margarine without trans fats. Vegetable oil. Low-fat, reduced-fat, or light  mayonnaise and salad dressings (reduced-sodium). Canola, safflower, olive, soybean, and sunflower oils. Avocado. Seasoning and other foods Herbs. Spices. Seasoning mixes without salt. Unsalted popcorn and pretzels. Fat-free sweets. What foods are not recommended? The items listed may not be a complete list. Talk with your dietitian about what dietary choices are best for you. Grains Baked goods made with fat, such as croissants, muffins, or some breads. Dry pasta or rice meal packs. Vegetables Creamed or fried vegetables. Vegetables in a cheese sauce. Regular canned vegetables (not low-sodium or reduced-sodium). Regular canned tomato sauce and paste (not low-sodium or reduced-sodium). Regular tomato and vegetable juice (not low-sodium or reduced-sodium). Rosita Fire. Olives. Fruits Canned fruit in a light or heavy syrup. Fried fruit. Fruit in cream or butter sauce. Meat and other protein foods Fatty cuts of meat. Ribs. Fried meat. Tomasa Blase. Sausage. Bologna and other processed lunch meats. Salami. Fatback. Hotdogs. Bratwurst. Salted nuts and seeds. Canned beans with added salt. Canned or smoked fish. Whole eggs or egg yolks. Chicken or [place] with skin. Dairy Whole or 2% milk, cream, and half-and-half. Whole or full-fat cream cheese. Whole-fat or sweetened yogurt. Full-fat cheese. Nondairy creamers. Whipped toppings. Processed cheese and cheese spreads. Fats and oils Butter. Stick margarine. Lard. Shortening. Ghee. Bacon fat. Tropical oils, such as coconut, palm kernel, or palm oil. Seasoning and other foods Salted popcorn and pretzels. Onion salt, garlic salt, seasoned salt, table salt, and sea salt. Worcestershire sauce. Tartar sauce. Barbecue sauce. Teriyaki sauce. Soy sauce, including reduced-sodium. Steak sauce. Canned and packaged gravies. Fish sauce. Oyster sauce. Cocktail sauce. Horseradish that you find on the shelf. Ketchup. Mustard. Meat flavorings and tenderizers. Bouillon cubes. Hot sauce and  Tabasco sauce. Premade or packaged marinades. Premade or packaged taco seasonings. Relishes.  Regular salad dressings. Where to find more information:  National Heart, Lung, and Blood Institute: PopSteam.is  American Heart Association: www.heart.org Summary  The DASH eating plan is a healthy eating plan that has been shown to reduce high blood pressure (hypertension). It may also reduce your risk for type 2 diabetes, heart disease, and stroke.  With the DASH eating plan, you should limit salt (sodium) intake to 2,300 mg a day. If you have hypertension, you may need to reduce your sodium intake to 1,500 mg a day.  When on the DASH eating plan, aim to eat more fresh fruits and vegetables, whole grains, lean proteins, low-fat dairy, and heart-healthy fats.  Work with your health care provider or diet and nutrition specialist (dietitian) to adjust your eating plan to your individual calorie needs. This information is not intended to replace advice given to you by your health care provider. Make sure you discuss any questions you have with your health care provider. Document Revised: 09/30/2017 Document Reviewed: 10/11/2016 Elsevier Patient Education  2020 [employer].

## 2020-10-30 ENCOUNTER — Telehealth: Payer: Self-pay

## 2020-10-30 NOTE — Telephone Encounter (Signed)
Faxed back signed request for refill or new PAP supplies to [employer] to [phone] on 10/30/20

## 2020-11-03 DIAGNOSIS — Z1211 Encounter for screening for malignant neoplasm of colon: Secondary | ICD-10-CM | POA: Diagnosis not present

## 2020-11-03 LAB — COLOGUARD
Cologuard: NEGATIVE
Cologuard: NEGATIVE

## 2020-11-07 ENCOUNTER — Telehealth: Payer: Self-pay | Admitting: Internal Medicine

## 2020-11-07 NOTE — Telephone Encounter (Signed)
Please advise 

## 2020-11-07 NOTE — Telephone Encounter (Signed)
Referral placed 10/22/20  Can you check [facility] pain referral for patient?

## 2020-11-07 NOTE — Telephone Encounter (Signed)
Patient is requesting a referral to [facility].

## 2020-11-10 ENCOUNTER — Telehealth: Payer: Self-pay | Admitting: Internal Medicine

## 2020-11-10 NOTE — Telephone Encounter (Signed)
I spoke with Olegario Messier at [facility] pain management she states she will give pt a call to sch.  lft vm with update on referral.

## 2020-11-10 NOTE — Telephone Encounter (Signed)
Noted  

## 2020-11-10 NOTE — Telephone Encounter (Signed)
Per fax respond note from [facility] states Dr Nicholaus Bloom will not be the best fit for pt. Please advise and Thank you!

## 2020-11-11 NOTE — Telephone Encounter (Signed)
Someone else in [facility] Dr. Corky Downs in [facility]   Thanks and inform pt

## 2020-11-11 NOTE — Telephone Encounter (Signed)
Good morning!  Received and faxed. I spoke with pt pt is ok.

## 2020-11-13 LAB — COLOGUARD: COLOGUARD: NEGATIVE

## 2020-11-27 ENCOUNTER — Telehealth: Payer: Self-pay | Admitting: Internal Medicine

## 2020-11-27 ENCOUNTER — Ambulatory Visit
Payer: Medicare HMO | Attending: Student in an Organized Health Care Education/Training Program | Admitting: Student in an Organized Health Care Education/Training Program

## 2020-11-27 ENCOUNTER — Other Ambulatory Visit: Payer: Self-pay

## 2020-11-27 ENCOUNTER — Other Ambulatory Visit: Payer: Self-pay | Admitting: Internal Medicine

## 2020-11-27 ENCOUNTER — Encounter: Payer: Self-pay | Admitting: Internal Medicine

## 2020-11-27 ENCOUNTER — Encounter: Payer: Self-pay | Admitting: Student in an Organized Health Care Education/Training Program

## 2020-11-27 VITALS — BP 145/99 | HR 83 | Temp 97.3°F | Ht 70.0 in | Wt 295.0 lb

## 2020-11-27 DIAGNOSIS — Z6841 Body Mass Index (BMI) 40.0 and over, adult: Secondary | ICD-10-CM | POA: Diagnosis not present

## 2020-11-27 DIAGNOSIS — M48062 Spinal stenosis, lumbar region with neurogenic claudication: Secondary | ICD-10-CM | POA: Diagnosis not present

## 2020-11-27 DIAGNOSIS — G894 Chronic pain syndrome: Secondary | ICD-10-CM | POA: Insufficient documentation

## 2020-11-27 DIAGNOSIS — G8929 Other chronic pain: Secondary | ICD-10-CM | POA: Diagnosis not present

## 2020-11-27 DIAGNOSIS — M47816 Spondylosis without myelopathy or radiculopathy, lumbar region: Secondary | ICD-10-CM

## 2020-11-27 DIAGNOSIS — M79651 Pain in right thigh: Secondary | ICD-10-CM

## 2020-11-27 DIAGNOSIS — M87051 Idiopathic aseptic necrosis of right femur: Secondary | ICD-10-CM

## 2020-11-27 DIAGNOSIS — M87052 Idiopathic aseptic necrosis of left femur: Secondary | ICD-10-CM | POA: Diagnosis not present

## 2020-11-27 DIAGNOSIS — M5441 Lumbago with sciatica, right side: Secondary | ICD-10-CM

## 2020-11-27 DIAGNOSIS — M25551 Pain in right hip: Secondary | ICD-10-CM

## 2020-11-27 MED ORDER — OXYCODONE-ACETAMINOPHEN 7.5-325 MG PO TABS: 1.0000 | ORAL_TABLET | Freq: Two times a day (BID) | ORAL | 0 refills | Status: DC | PRN

## 2020-11-27 NOTE — Progress Notes (Signed)
Safety precautions to be maintained throughout the outpatient stay will include: orient to surroundings, keep bed in low position, maintain call bell within reach at all times, provide assistance with transfer out of bed and ambulation.  

## 2020-11-27 NOTE — Telephone Encounter (Signed)
LMTCB for negative results.

## 2020-11-27 NOTE — Telephone Encounter (Signed)
I cant prescribe more than 10 days of pain medications with will be last fill   further medications will need to be by ortho OR pain clinic   The first Rx was temp supply and so is this

## 2020-11-27 NOTE — Progress Notes (Signed)
Patient: Scott Villanueva  Service Category: E/M  Provider: Gillis Santa, MD  DOB: 01-08-1961  DOS: 11/27/2020  Referring Provider: Orland Mustard *  MRN: [id number]  Setting: Ambulatory outpatient  PCP: McLean-Scocuzza, Nino Glow, MD  Type: New Patient  Specialty: Interventional Pain Management    Location: Office  Delivery: Face-to-face     Primary Reason(s) for Visit: Encounter for initial evaluation of one or more chronic problems (new to examiner) potentially causing chronic pain, and posing a threat to normal musculoskeletal function. (Level of risk: High) CC: Back Pain  HPI  [name] is a 60 y.o. year old, male patient, who comes for the first time to our practice referred by McLean-Scocuzza, Olivia Mackie * for our initial evaluation of his chronic pain. He has Hypertension; CAD (coronary artery disease); HLD (hyperlipidemia); Obesity; Chronic nausea; Prediabetes; Avascular necrosis of bones of both hips ([place]); OSA on CPAP; Spinal stenosis of lumbar region; Arthritis of facet joint of lumbar spine; Morbid obesity with BMI of 40.0-44.9, adult ([place]); Chronic right-sided low back pain with right-sided sciatica; Abnormal gait; and Chronic pain syndrome on their problem list. Today he comes in for evaluation of his Back Pain  Pain Assessment: Location: Left,Right Back (right hip) Radiating: at times down right leg to his foot, spasm in middle toe Onset: More than a month ago Duration: Chronic pain Quality: Aching,Burning,Throbbing,Stabbing,Sharp,Shooting Severity: 10-Worst pain ever/10 (subjective, self-reported pain score)  Effect on ADL: limits my daily activities Timing: Constant Modifying factors: deep tissue massage machine, meds, heat BP: (!) 145/99  HR: 83  Onset and Duration: Gradual and Date of onset: 12/2015 Cause of pain: Work related accident or event Severity: Getting better, NAS-11 at its worse: 10/10, NAS-11 at its best: 4/10, NAS-11 now: 6/10 and NAS-11 on the average:  8/10 Timing: Night, During activity or exercise, After activity or exercise and After a period of immobility Aggravating Factors: Bending, Climbing, Intercourse (sex), Kneeling, Lifiting, Motion, Prolonged sitting, Prolonged standing, Squatting, Stooping , Twisting, Walking, Walking uphill, Walking downhill and Working Alleviating Factors: Stretching, Cold packs, Hot packs, Lying down, Medications, Resting, Sitting, Sleeping, TENS, Using a brace and Warm showers or baths Associated Problems: Depression, Fatigue, Inability to control bladder (urine), Inability to control bowel, Nausea, Numbness, Weakness and Pain that wakes patient up Quality of Pain: Aching, Annoying, Burning, Constant, Cramping, Nagging, Pressure-like, Shooting, Stabbing and Throbbing Previous Examinations or Tests: CT scan, EMG/PNCV, MRI scan and Nerve conduction test Previous Treatments: Epidural steroid injections, Narcotic medications, Physical Therapy and Steroid treatments by mouth  [name] is a pleasant 60 year old male who presents with a chief complaint of low back pain as well as bilateral hip pain related to advanced lumbar facet arthropathy, bilateral avascular necrosis of femoral heads.  Of note patient also has a history of left shoulder pain.  Patient had a left shoulder replacement in December 2018 related to shoulder osteoarthritis and rotator cuff dysfunction.  He states that for his low back pain he had many steroid injections for his facet joint disease.  Subsequently, he developed bilateral hip pain which was found to be avascular necrosis.  Patient was seeing [facility] prior.  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  He is being referred here by his primary care provider to discuss pain management.  He states that he does find pain relief with tramadol and Percocet, he prefers Percocet.  He may take 1 tablet a day for severe breakthrough  pain  which is usually worse in the evening when he has exerted himself.  Patient has an impressive cardiac history with history of CABG and is anticoagulated with Plavix.  He has a history of morbid obesity.  He has done physical therapy in the past.  He is a smoker and has cut down considerably from the amount he was smoking years ago.  He is currently not on any opioid narcotics.  Historic Controlled Substance Pharmacotherapy Review  Historical Monitoring: The patient  reports no history of drug use. List of all UDS Test(s): No results found for: MDMA, COCAINSCRNUR, [place], [place], CANNABQUANT, [place], [place] List of other Serum/Urine Drug Screening Test(s):  No results found for: AMPHSCRSER, BARBSCRSER, BENZOSCRSER, COCAINSCRSER, COCAINSCRNUR, PCPSCRSER, PCPQUANT, THCSCRSER, THCU, CANNABQUANT, OPIATESCRSER, OXYSCRSER, PROPOXSCRSER, ETH Historical Background Evaluation: [place] PMP: PDMP not reviewed this encounter. Online review of the past 57-monthperiod conducted.             [place] Department of public safety, offender search: (Editor, commissioningInformation) Non-contributory Risk Assessment Profile: Aberrant behavior: None observed or detected today Risk factors for fatal opioid overdose: None identified today Fatal overdose hazard ratio (HR): Calculation deferred Non-fatal overdose hazard ratio (HR): Calculation deferred Risk of opioid abuse or dependence: 0.7-3.0% with doses ? 36 MME/day and 6.1-26% with doses ? 120 MME/day. Substance use disorder (SUD) risk level: See below Personal History of Substance Abuse (SUD-Substance use disorder):  Alcohol: Negative  Illegal Drugs: Negative  Rx Drugs: Negative  ORT Risk Level calculation: Low Risk  Opioid Risk Tool - 11/27/20 1308      Family History of Substance Abuse   Alcohol Negative    Illegal Drugs Negative    Rx Drugs Negative      Personal History of Substance Abuse   Alcohol Negative    Illegal Drugs Negative    Rx Drugs Negative      Age   Age  between 132-45years  No      History of Preadolescent Sexual Abuse   History of Preadolescent Sexual Abuse Negative or Male      Psychological Disease   Psychological Disease Negative    Depression Negative      Total Score   Opioid Risk Tool Scoring 0    Opioid Risk Interpretation Low Risk            Pharmacologic Plan: As per protocol, I have not taken over any controlled substance management, pending the results of ordered tests and/or consults.            Initial impression: Pending review of available data and ordered tests.  Meds   Current Outpatient Medications:  .  albuterol (ACCUNEB) 0.63 MG/3ML nebulizer solution, Take 3 mLs (0.63 mg total) by nebulization every 6 (six) hours as needed for wheezing., Disp: 360 mL, Rfl: 11 .  amLODipine (NORVASC) 10 MG tablet, Take 1 tablet (10 mg total) by mouth daily., Disp: 90 tablet, Rfl: 3 .  aspirin EC 81 MG tablet, Take 1 tablet (81 mg total) by mouth daily., Disp: 90 tablet, Rfl: 3 .  atorvastatin (LIPITOR) 40 MG tablet, Take 1 tablet (40 mg total) by mouth daily at 6 PM., Disp: 90 tablet, Rfl: 3 .  Cholecalciferol (VITAMIN D3) 50 MCG (2000 UT) TABS, Take 6,000 tablets by mouth., Disp: , Rfl:  .  clopidogrel (PLAVIX) 75 MG tablet, Take 1 tablet (75 mg total) by mouth daily. Further refills Dr. KDoylene Canard Disp: 90 tablet, Rfl: 3 .  Ginkgo Biloba 40 MG  TABS, Take 1 tablet by mouth daily., Disp: , Rfl:  .  losartan (COZAAR) 50 MG tablet, Take 1 tablet (50 mg total) by mouth daily., Disp: 90 tablet, Rfl: 3 .  methocarbamol (ROBAXIN) 500 MG tablet, Take 500 mg by mouth 4 (four) times daily., Disp: , Rfl:  .  metoprolol succinate (TOPROL-XL) 25 MG 24 hr tablet, Take 1 tablet (25 mg total) by mouth daily., Disp: 90 tablet, Rfl: 3 .  Multiple Vitamin (MULTIVITAMIN WITH MINERALS) TABS tablet, Take 1 tablet by mouth daily., Disp: , Rfl:  .  Omega-3 Fatty Acids (FISH OIL PO), Take by mouth., Disp: , Rfl:  .  oxyCODONE-acetaminophen (PERCOCET)  7.5-325 MG tablet, Take 1 tablet by mouth 2 (two) times daily as needed for severe pain., Disp: 10 tablet, Rfl: 0  Imaging Review  TECHNIQUE: Sagittal and axial T1 and T2-weighted sequences were performed. Additional sagittal STIR images were performed.   COMPARISON: None available   FINDINGS:  # Vertebral bodies: No compression fracture.  # Alignment: Normal.  # Marrow signal: No significant abnormality.  # Conus medullaris: Normal. Terminates at T12-L1 with no evidence of tethering.  # Lower thoracic segments: No significant abnormality.   # L1-2: Unremarkable  # L2-3: Mild degenerative disc disease. Mild facet joint arthritis.  # L3-4: Mild degenerative disc disease. Mild facet joint arthritis.  # L4-5: Moderately severe facet joint arthritis. Small facet joint effusion on the left. Mild thickening of the ligamentum flavum. Mild spinal stenosis.  # L5-S1: Moderate facet joint arthritis. No spinal or foraminal stenosis.    Narrative Clinical Data:   Chronic foot pain, worsening over the last two weeks.  No acute injury.  The pain is reportedly at the first metatarsal-phalangeal joint and most significant with weight-bearing. LEFT FOOT - 3 VIEWS: No comparison. Findings:  The medial and lateral sesamoids of the first metatarsal are fragmented.  These appear well corticated and are most compatible with bipartite sesamoids.  There is no evidence of acute fracture or dislocation.  A small metallic density overlaps the lateral sesamoid on the oblique view.  This is not seen on the other views and appears artifactual.  Impression No acute osseous findings are seen.  The medial and lateral sesamoids of the first metatarsal appear bipartite.  Consider sesamoid dysfunction; MRI may be helpful for further evaluation if clinically warranted.  Provider: Joretta Bachelor    Complexity Note: Imaging results reviewed. Results shared with Mr. Swanner, using Layman's terms.                          ROS  Cardiovascular: High blood pressure, Heart surgery and Heart catheterization Pulmonary or Respiratory: Wheezing and difficulty taking a deep full breath (Asthma), Difficulty blowing air out (Emphysema) and Smoking Neurological: No reported neurological signs or symptoms such as seizures, abnormal skin sensations, urinary and/or fecal incontinence, being born with an abnormal open spine and/or a tethered spinal cord Psychological-Psychiatric: Anxiousness, Depressed and Prone to panicking Gastrointestinal: No reported gastrointestinal signs or symptoms such as vomiting or evacuating blood, reflux, heartburn, alternating episodes of diarrhea and constipation, inflamed or scarred liver, or pancreas or irrregular and/or infrequent bowel movements Genitourinary: No reported renal or genitourinary signs or symptoms such as difficulty voiding or producing urine, peeing blood, non-functioning kidney, kidney stones, difficulty emptying the bladder, difficulty controlling the flow of urine, or chronic kidney disease Hematological: No reported hematological signs or symptoms such as prolonged bleeding, low or poor functioning platelets,  bruising or bleeding easily, hereditary bleeding problems, low energy levels due to low hemoglobin or being anemic Endocrine: No reported endocrine signs or symptoms such as high or low blood sugar, rapid heart rate due to high thyroid levels, obesity or weight gain due to slow thyroid or thyroid disease Rheumatologic: Joint aches and or swelling due to excess weight (Osteoarthritis), Rheumatoid arthritis and No reported rheumatological signs and symptoms such as fatigue, joint pain, tenderness, swelling, redness, heat, stiffness, decreased range of motion, with or without associated rash Musculoskeletal: Negative for myasthenia gravis, muscular dystrophy, multiple sclerosis or malignant hyperthermia Work History: Disabled  Allergies  Mr. Lizer is allergic to no  known allergies.  Laboratory Chemistry Profile   Renal Lab Results  Component Value Date   BUN 15 01/15/2020   CREATININE 1.05 01/15/2020   GFR 72.94 01/15/2020   GFRAA >60 09/08/2014   GFRNONAA 59 (L) 09/08/2014   PROTEINUR NEGATIVE 01/15/2020     Electrolytes Lab Results  Component Value Date   NA 139 01/15/2020   K 4.3 01/15/2020   CL 104 01/15/2020   CALCIUM 9.4 01/15/2020   MG 2.4 05/09/2007     Hepatic Lab Results  Component Value Date   AST 19 01/15/2020   ALT 28 01/15/2020   ALBUMIN 4.1 01/15/2020   ALKPHOS 70 01/15/2020     ID Lab Results  Component Value Date   HIV NON-REACTIVE 01/15/2020     Bone Lab Results  Component Value Date   VD25OH 44.14 01/15/2020     Endocrine Lab Results  Component Value Date   GLUCOSE 125 (H) 01/15/2020   GLUCOSEU NEGATIVE 01/15/2020   HGBA1C 6.0 01/15/2020   TSH 2.85 01/15/2020     Neuropathy Lab Results  Component Value Date   HGBA1C 6.0 01/15/2020   HIV NON-REACTIVE 01/15/2020     CNS No results found for: COLORCSF, APPEARCSF, RBCCOUNTCSF, WBCCSF, POLYSCSF, LYMPHSCSF, EOSCSF, PROTEINCSF, GLUCCSF, JCVIRUS, CSFOLI, IGGCSF, LABACHR, ACETBL, LABACHR, ACETBL   Inflammation (CRP: Acute  ESR: Chronic) No results found for: CRP, ESRSEDRATE, LATICACIDVEN   Rheumatology No results found for: RF, ANA, LABURIC, URICUR, LYMEIGGIGMAB, LYMEABIGMQN, HLAB27   Coagulation Lab Results  Component Value Date   INR 0.9 05/09/2007   LABPROT 12.3 05/09/2007   APTT 30 05/09/2007   PLT 204.0 01/15/2020     Cardiovascular Lab Results  Component Value Date   CKTOTAL 73 05/10/2007   CKMB 1.7 05/10/2007   TROPONINI  05/10/2007    0.06        PERSISTENTLY INCREASED TROPONIN VALUES IN THE RANGE OF 0.06-0.49 ng/mL CAN BE SEEN IN:       -UNSTABLE ANGINA   HGB 14.5 01/15/2020   HCT 43.3 01/15/2020     Screening Lab Results  Component Value Date   HIV NON-REACTIVE 01/15/2020     Cancer No results found for: CEA,  CA125, LABCA2   Allergens No results found for: ALMOND, APPLE, ASPARAGUS, AVOCADO, BANANA, BARLEY, BASIL, BAYLEAF, GREENBEAN, LIMABEAN, WHITEBEAN, BEEFIGE, REDBEET, BLUEBERRY, BROCCOLI, CABBAGE, MELON, CARROT, CASEIN, CASHEWNUT, CAULIFLOWER, CELERY     Note: Lab results reviewed.  [place]  Drug: Mr. Pouliot  reports no history of drug use. Alcohol:  reports current alcohol use of about 6.0 standard drinks of alcohol per week. Tobacco:  reports that he has been smoking cigarettes. He has never used smokeless tobacco. Medical:  has a past medical history of Anginal pain ([place]), CAD (coronary artery disease), Difficult intubation, Diverticulitis, Dysrhythmia, HLD (hyperlipidemia), Hypertension, Myocardial infarction ([place]), Obesity, Pneumothorax (  2006), and Sleep apnea. Family: family history includes Anemia in his mother; CAD in his father; Depression in his mother and sister; Huntington's disease in his mother and sister; Insomnia in his brother; Obesity in his father; Schizophrenia in his brother; Thyroid disease in his mother.  Past Surgical History:  Procedure Laterality Date  . CARDIAC CATHETERIZATION     with stents  . PLEURAL SCARIFICATION Left 2006  . SHOULDER ARTHROSCOPY WITH OPEN ROTATOR CUFF REPAIR Left 10/12/2017   Procedure: SHOULDER ARTHROSCOPY WITH OPEN ROTATOR CUFF REPAIR;  Surgeon: Earnestine Leys, MD;  Location: [facility] ORS;  Service: Orthopedics;  Laterality: Left;  Left shoulder arthroscopy, subaccromial decompression, distal clavical resection and biceps tenotomy  . shoulder joint replacement Left    surgery x 2 Dr. [place] Pulling ortho  . TONSILLECTOMY     Active Ambulatory Problems    Diagnosis Date Noted  . Hypertension   . CAD (coronary artery disease)   . HLD (hyperlipidemia)   . Obesity   . Chronic nausea 12/12/2019  . Prediabetes 01/16/2020  . Avascular necrosis of bones of both hips ([place]) 03/12/2020  . OSA on CPAP 09/22/2020  . Spinal stenosis of lumbar region 10/22/2020   . Arthritis of facet joint of lumbar spine 10/22/2020  . Morbid obesity with BMI of 40.0-44.9, adult ([place]) 10/22/2020  . Chronic right-sided low back pain with right-sided sciatica 10/22/2020  . Abnormal gait 10/22/2020  . Chronic pain syndrome 11/27/2020   Resolved Ambulatory Problems    Diagnosis Date Noted  . No Resolved Ambulatory Problems   Past Medical History:  Diagnosis Date  . Anginal pain ([place])   . Difficult intubation   . Diverticulitis   . Dysrhythmia   . Myocardial infarction ([place])   . Pneumothorax 2006  . Sleep apnea    Constitutional Exam  General appearance: Well nourished, well developed, and well hydrated. In no apparent acute distress Vitals:   11/27/20 1257  BP: (!) 145/99  Pulse: 83  Temp: (!) 97.3 F (36.3 C)  SpO2: 99%  Weight: 295 lb (133.8 kg)  Height: 5' 10"  (1.778 m)   BMI Assessment: Estimated body mass index is 42.33 kg/m as calculated from the following:   Height as of this encounter: 5' 10"  (1.778 m).   Weight as of this encounter: 295 lb (133.8 kg).  BMI interpretation table: BMI level Category Range association with higher incidence of chronic pain  <18 kg/m2 Underweight   18.5-24.9 kg/m2 Ideal body weight   25-29.9 kg/m2 Overweight Increased incidence by 20%  30-34.9 kg/m2 Obese (Class I) Increased incidence by 68%  35-39.9 kg/m2 Severe obesity (Class II) Increased incidence by 136%  >40 kg/m2 Extreme obesity (Class III) Increased incidence by 254%   Patient's current BMI Ideal Body weight  Body mass index is 42.33 kg/m. Ideal body weight: 73 kg (160 lb 15 oz) Adjusted ideal body weight: 97.3 kg (214 lb 9 oz)   BMI Readings from Last 4 Encounters:  11/27/20 42.33 kg/m  10/22/20 44.48 kg/m  03/12/20 44.25 kg/m  12/18/19 45.51 kg/m   Wt Readings from Last 4 Encounters:  11/27/20 295 lb (133.8 kg)  10/22/20 (!) 310 lb (140.6 kg)  03/12/20 (!) 308 lb 6.4 oz (139.9 kg)  12/18/19 (!) 317 lb 3.2 oz (143.9 kg)     Psych/Mental status: Alert, oriented x 3 (person, place, & time)       Eyes: PERLA Respiratory: No evidence of acute respiratory distress  Cervical Spine Exam  Skin & Axial Inspection: No  masses, redness, edema, swelling, or associated skin lesions Alignment: Symmetrical Functional ROM: Unrestricted ROM      Stability: No instability detected Muscle Tone/Strength: Functionally intact. No obvious neuro-muscular anomalies detected. Sensory (Neurological): Unimpaired Palpation: No palpable anomalies              Upper Extremity (UE) Exam    Side: Right upper extremity  Side: Left upper extremity  Skin & Extremity Inspection: Skin color, temperature, and hair growth are WNL. No peripheral edema or cyanosis. No masses, redness, swelling, asymmetry, or associated skin lesions. No contractures.  Skin & Extremity Inspection: Skin color, temperature, and hair growth are WNL. No peripheral edema or cyanosis. No masses, redness, swelling, asymmetry, or associated skin lesions. No contractures.  Functional ROM: Unrestricted ROM          Functional ROM: Pain restricted ROM for shoulder and elbow  Muscle Tone/Strength: Functionally intact. No obvious neuro-muscular anomalies detected.   Muscle Tone/Strength: Functionally intact. No obvious neuro-muscular anomalies detected.  Sensory (Neurological): Unimpaired          Sensory (Neurological): Arthropathic arthralgia          Palpation: No palpable anomalies              Palpation: No palpable anomalies              Provocative Test(s):  Phalen's test: deferred Tinel's test: deferred Apley's scratch test (touch opposite shoulder):  Action 1 (Across chest): deferred Action 2 (Overhead): deferred Action 3 (LB reach): deferred   Provocative Test(s):  Phalen's test: deferred Tinel's test: deferred Apley's scratch test (touch opposite shoulder):  Action 1 (Across chest): Decreased ROM Action 2 (Overhead): Decreased ROM Action 3 (LB reach):  Decreased ROM    Thoracic Spine Area Exam  Skin & Axial Inspection: No masses, redness, or swelling Alignment: Symmetrical Functional ROM: Unrestricted ROM Stability: No instability detected Muscle Tone/Strength: Functionally intact. No obvious neuro-muscular anomalies detected. Sensory (Neurological): Unimpaired  Lumbar Exam  Skin & Axial Inspection: No masses, redness, or swelling Alignment: Symmetrical Functional ROM: Unrestricted ROM       Stability: No instability detected Muscle Tone/Strength: Functionally intact. No obvious neuro-muscular anomalies detected. Sensory (Neurological): Musculoskeletal pain pattern Palpation: No palpable anomalies       Provocative Tests: Hyperextension/rotation test: (+) bilaterally for facet joint pain. Lumbar quadrant test (Kemp's test): deferred today         Gait & Posture Assessment  Ambulation: Unassisted Gait: Relatively normal for age and body habitus Posture: WNL   Lower Extremity Exam    Side: Right lower extremity  Side: Left lower extremity  Stability: No instability observed          Stability: No instability observed          Skin & Extremity Inspection: Skin color, temperature, and hair growth are WNL. No peripheral edema or cyanosis. No masses, redness, swelling, asymmetry, or associated skin lesions. No contractures.  Skin & Extremity Inspection: Skin color, temperature, and hair growth are WNL. No peripheral edema or cyanosis. No masses, redness, swelling, asymmetry, or associated skin lesions. No contractures.  Functional ROM: Pain restricted ROM for hip joint          Functional ROM: Pain restricted ROM for hip joint          Muscle Tone/Strength: Functionally intact. No obvious neuro-muscular anomalies detected.  Muscle Tone/Strength: Functionally intact. No obvious neuro-muscular anomalies detected.  Sensory (Neurological): Arthropathic arthralgia        Sensory (Neurological): Arthropathic  arthralgia         DTR: Patellar: deferred today Achilles: deferred today Plantar: deferred today  DTR: Patellar: deferred today Achilles: deferred today Plantar: deferred today  Palpation: No palpable anomalies  Palpation: No palpable anomalies   Assessment  Primary Diagnosis & Pertinent Problem List: The primary encounter diagnosis was Spinal stenosis of lumbar region with neurogenic claudication. Diagnoses of Arthritis of facet joint of lumbar spine, Morbid obesity with BMI of 40.0-44.9, adult ([place]), Chronic right-sided low back pain with right-sided sciatica, Avascular necrosis of bones of both hips ([place]), and Chronic pain syndrome were also pertinent to this visit.  Visit Diagnosis (New problems to examiner): 1. Spinal stenosis of lumbar region with neurogenic claudication   2. Arthritis of facet joint of lumbar spine   3. Morbid obesity with BMI of 40.0-44.9, adult ([place])   4. Chronic right-sided low back pain with right-sided sciatica   5. Avascular necrosis of bones of both hips ([place])   6. Chronic pain syndrome    Plan of Care (Initial workup plan)  Note: Mr. Fudala was reminded that as per protocol, today's visit has been an evaluation only. We have not taken over the patient's controlled substance management.  General Recommendations: The pain condition that the patient suffers from is best treated with a multidisciplinary approach that involves an increase in physical activity to prevent de-conditioning and worsening of the pain cycle, as well as psychological counseling (formal and/or informal) to address the co-morbid psychological affects of pain. Treatment will often involve judicious use of pain medications and interventional procedures to decrease the pain, allowing the patient to participate in the physical activity that will ultimately produce long-lasting pain reductions. The goal of the multidisciplinary approach is to return the patient to a higher level of overall function and to restore  their ability to perform activities of daily living.  Regards to medication management, specifically chronic opioid therapy, we will obtain baseline urine toxicology screen.  So long as that is appropriate, will have patient sign pain contract and can consider low-dose tramadol versus Percocet.  Avoid steroid injections.  Patient is on Plavix and high risk to be off.  Not a candidate for axillary nerve peripheral nerve stimulation given that he is on Plavix.   Lab Orders     Compliance Drug Analysis, Ur  Pharmacological management options:  Opioid Analgesics: The patient was informed that there is no guarantee that he would be a candidate for opioid analgesics. The decision will be made following CDC guidelines. This decision will be based on the results of diagnostic studies, as well as Mr. Genis's risk profile.   Membrane stabilizer: To be determined at a later time  Muscle relaxant: Adequate regimen Robaxin, as needed  NSAID: Medically contraindicated on Plavix  Other analgesic(s): To be determined at a later time    Provider-requested follow-up: Return in about 2 weeks (around 12/11/2020) for 2nd pt visit.  Future Appointments  Date Time Provider [place]  12/11/2020  2:20 PM Gillis Santa, MD [facility] None  01/23/2021 10:30 AM McLean-Scocuzza, Nino Glow, MD [facility]    Note by: Gillis Santa, MD Date: 11/27/2020; Time: 1:58 PM

## 2020-11-27 NOTE — Telephone Encounter (Signed)
cologuard negative repeat in 3 years Thank you for doing this

## 2020-11-28 NOTE — Telephone Encounter (Signed)
Left message to return call. ° °Mychart message sent  °

## 2020-11-28 NOTE — Telephone Encounter (Signed)
Mychart message sent due to being unable to contact Patient today for a previous telephone encounter

## 2020-12-05 LAB — COMPLIANCE DRUG ANALYSIS, UR

## 2020-12-11 ENCOUNTER — Ambulatory Visit: Payer: Medicare HMO | Admitting: Student in an Organized Health Care Education/Training Program

## 2020-12-12 ENCOUNTER — Other Ambulatory Visit: Payer: Self-pay

## 2020-12-12 ENCOUNTER — Telehealth: Payer: Self-pay | Admitting: Internal Medicine

## 2020-12-12 DIAGNOSIS — I2510 Atherosclerotic heart disease of native coronary artery without angina pectoris: Secondary | ICD-10-CM

## 2020-12-12 DIAGNOSIS — E785 Hyperlipidemia, unspecified: Secondary | ICD-10-CM

## 2020-12-12 DIAGNOSIS — I10 Essential (primary) hypertension: Secondary | ICD-10-CM

## 2020-12-12 MED ORDER — ATORVASTATIN CALCIUM 40 MG PO TABS: 40.0000 mg | ORAL_TABLET | Freq: Every day | ORAL | 3 refills | Status: DC

## 2020-12-12 MED ORDER — AMLODIPINE BESYLATE 10 MG PO TABS: 10.0000 mg | ORAL_TABLET | Freq: Every day | ORAL | 3 refills | Status: DC

## 2020-12-12 NOTE — Telephone Encounter (Signed)
Left message for patient to call back and schedule Medicare Annual Wellness Visit (AWV)   This should be a virtual visit only=30 minutes.  No hx of AWV; please schedule at anytime with Denisa O'Brien-Blaney at [place]   

## 2020-12-17 ENCOUNTER — Encounter: Payer: Self-pay | Admitting: Internal Medicine

## 2020-12-17 ENCOUNTER — Other Ambulatory Visit: Payer: Self-pay

## 2020-12-17 ENCOUNTER — Ambulatory Visit (INDEPENDENT_AMBULATORY_CARE_PROVIDER_SITE_OTHER): Payer: Medicare HMO | Admitting: Internal Medicine

## 2020-12-17 ENCOUNTER — Telehealth: Payer: Self-pay | Admitting: Internal Medicine

## 2020-12-17 VITALS — BP 128/78 | Ht 70.0 in | Wt 295.0 lb

## 2020-12-17 DIAGNOSIS — R7303 Prediabetes: Secondary | ICD-10-CM

## 2020-12-17 DIAGNOSIS — H547 Unspecified visual loss: Secondary | ICD-10-CM

## 2020-12-17 DIAGNOSIS — M8700 Idiopathic aseptic necrosis of unspecified bone: Secondary | ICD-10-CM

## 2020-12-17 NOTE — Progress Notes (Signed)
telephone Note  I connected with Scott Villanueva  on 12/17/20 at  1:30 PM EST by telephone and verified that I am speaking with the correct person using two identifiers.  Location patient: home, [place] Location provider:work or home office Persons participating in the virtual visit: patient, provider  I discussed the limitations of evaluation and management by telemedicine and the availability of in person appointments. The patient expressed understanding and agreed to proceed.   HPI:  F/u  1. AVN b/l hips right >L appt ortho Dr. Corky Downs 01/01/21 declines to do surgery but will disc other options 2. H/o CAD will call and sch appt Dr. Algie Coffer  3. Reduced vision up close letters, computer, small print and reading things worse  ROS: See pertinent positives and negatives per HPI.  Past Medical History:  Diagnosis Date  . Anginal pain (HCC)   . CAD (coronary artery disease)    s/p stent Dr. Algie Coffer; in 40s stents  . Difficult intubation   . Diverticulitis   . Dysrhythmia   . HLD (hyperlipidemia)   . Hypertension   . Myocardial infarction (HCC)     Feb.2008  . Obesity   . Pneumothorax 2006  . Sleep apnea    CPAP    Past Surgical History:  Procedure Laterality Date  . CARDIAC CATHETERIZATION     with stents  . PLEURAL SCARIFICATION Left 2006  . SHOULDER ARTHROSCOPY WITH OPEN ROTATOR CUFF REPAIR Left 10/12/2017   Procedure: SHOULDER ARTHROSCOPY WITH OPEN ROTATOR CUFF REPAIR;  Surgeon: Deeann Saint, MD;  Location: [facility] ORS;  Service: Orthopedics;  Laterality: Left;  Left shoulder arthroscopy, subaccromial decompression, distal clavical resection and biceps tenotomy  . shoulder joint replacement Left    surgery x 2 Dr. Andrey Campanile ortho  . TONSILLECTOMY       Current Outpatient Medications:  .  albuterol (ACCUNEB) 0.63 MG/3ML nebulizer solution, Take 3 mLs (0.63 mg total) by nebulization every 6 (six) hours as needed for wheezing., Disp: 360 mL, Rfl: 11 .  amLODipine (NORVASC) 10 MG  tablet, Take 1 tablet (10 mg total) by mouth daily., Disp: 90 tablet, Rfl: 3 .  aspirin EC 81 MG tablet, Take 1 tablet (81 mg total) by mouth daily., Disp: 90 tablet, Rfl: 3 .  atorvastatin (LIPITOR) 40 MG tablet, Take 1 tablet (40 mg total) by mouth daily at 6 PM., Disp: 90 tablet, Rfl: 3 .  CALCIUM PO, Take by mouth., Disp: , Rfl:  .  Cholecalciferol (VITAMIN D3) 50 MCG (2000 UT) TABS, Take 6,000 tablets by mouth., Disp: , Rfl:  .  clopidogrel (PLAVIX) 75 MG tablet, Take 1 tablet (75 mg total) by mouth daily. Further refills Dr. Algie Coffer, Disp: 90 tablet, Rfl: 3 .  Ginkgo Biloba 40 MG TABS, Take 1 tablet by mouth daily., Disp: , Rfl:  .  losartan (COZAAR) 50 MG tablet, Take 1 tablet (50 mg total) by mouth daily., Disp: 90 tablet, Rfl: 3 .  methocarbamol (ROBAXIN) 500 MG tablet, Take 500 mg by mouth 4 (four) times daily., Disp: , Rfl:  .  metoprolol succinate (TOPROL-XL) 25 MG 24 hr tablet, Take 1 tablet (25 mg total) by mouth daily., Disp: 90 tablet, Rfl: 3 .  Multiple Vitamin (MULTIVITAMIN WITH MINERALS) TABS tablet, Take 1 tablet by mouth daily., Disp: , Rfl:  .  Omega-3 Fatty Acids (FISH OIL PO), Take by mouth., Disp: , Rfl:  .  oxyCODONE-acetaminophen (PERCOCET) 7.5-325 MG tablet, Take 1 tablet by mouth 2 (two) times daily as needed for severe  pain., Disp: 10 tablet, Rfl: 0  EXAM:  VITALS per patient if applicable:  GENERAL: alert, oriented, appears well and in no acute distress   PSYCH/NEURO: pleasant and cooperative, no obvious depression or anxiety, speech and thought processing grossly intact  ASSESSMENT AND PLAN:  Discussed the following assessment and plan:  AVN (avascular necrosis of bone) (HCC) appt ortho Dr. Paralee Cancel 01/01/21  Will do percocet 7.5-325 mg bid #10 only pain contract pt to sign   Prediabetes - Plan: Ambulatory referral to Ophthalmology A1C 6.0 Reduced vision - Plan: Ambulatory referral to Ophthalmology  HM Flu shot had utd  Tdaputd3/16/21 Consider  shingrix vaccine  covid 19 vx 2/2utd pfizer 10/19/20 (3/3)  normalPSA 01/15/20   Ref. Range 01/15/2020 09:19  PSA Latest Ref Range: 0.10 - 4.00 ng/mL 0.98    Colonoscopy referred leb Gi Dr. Gildardo Cranker 12/2019 pt never scheduled given # to call for future  11/03/20 negative cologuard    Skinreferred derm previously Dr. Roseanne Reno in 2021/2022 skin tags removed doing well as of 12/17/20   Smoker since age 27 y.o light per pt always <1 ppd on and off 1 pk lasting 1-3 months x 45 years on and off   Consider testing for HD in future mother died of HD, 1/2 has gene as well  Cardiology Dr. Algie Coffer as of 12/17/20 has not seen   rec healthy diet and exercise   AVN b/l hips R>L [facility] ortho Dr. Lyda Kalata appt 01/01/21 does not want surgery consider nonsurgical tx's  Mri right hip  IMPRESSION:   1. Avascular necrosis in the right femoral head with mild reactive edema in the head/neck, and a moderate joint effusion.  2. Small focus of chronic avascular necrosis in the left femoral head, without associated edema or effusion.    -we discussed possible serious and likely etiologies, options for evaluation and workup, limitations of telemedicine visit vs in person visit, treatment, treatment risks and precautions.   I discussed the assessment and treatment plan with the patient. The patient was provided an opportunity to ask questions and all were answered. The patient agreed with the plan and demonstrated an understanding of the instructions.    Time spent 20 min Bevelyn Buckles, MD     Provider: Dr. French Ana McLean-Scocuzza-Internal Medicine

## 2020-12-17 NOTE — Telephone Encounter (Signed)
Type of pain contract for pt to come and sign percocet 7.5-325 mg 2x per day # 10/month for AVN [facility]

## 2020-12-18 NOTE — Telephone Encounter (Signed)
Noted  

## 2020-12-18 NOTE — Telephone Encounter (Signed)
Contract created and printed.  Left message to return call needing Patient to come in and sign contract

## 2020-12-18 NOTE — Telephone Encounter (Signed)
Patient was returning call  He stated that he would be by around 2:30 today

## 2020-12-22 ENCOUNTER — Other Ambulatory Visit: Payer: Self-pay | Admitting: Internal Medicine

## 2020-12-22 DIAGNOSIS — M79651 Pain in right thigh: Secondary | ICD-10-CM

## 2020-12-22 DIAGNOSIS — M87051 Idiopathic aseptic necrosis of right femur: Secondary | ICD-10-CM

## 2020-12-22 DIAGNOSIS — M25551 Pain in right hip: Secondary | ICD-10-CM

## 2020-12-22 DIAGNOSIS — G8929 Other chronic pain: Secondary | ICD-10-CM

## 2020-12-22 DIAGNOSIS — M47816 Spondylosis without myelopathy or radiculopathy, lumbar region: Secondary | ICD-10-CM

## 2020-12-22 MED ORDER — OXYCODONE-ACETAMINOPHEN 7.5-325 MG PO TABS: 1.0000 | ORAL_TABLET | Freq: Two times a day (BID) | ORAL | 0 refills | Status: DC | PRN

## 2020-12-22 NOTE — Telephone Encounter (Signed)
Patient signed contract and this was sent to scan.

## 2021-01-01 DIAGNOSIS — M76891 Other specified enthesopathies of right lower limb, excluding foot: Secondary | ICD-10-CM | POA: Diagnosis not present

## 2021-01-01 DIAGNOSIS — M1611 Unilateral primary osteoarthritis, right hip: Secondary | ICD-10-CM | POA: Diagnosis not present

## 2021-01-01 DIAGNOSIS — M87051 Idiopathic aseptic necrosis of right femur: Secondary | ICD-10-CM | POA: Diagnosis not present

## 2021-01-05 ENCOUNTER — Encounter: Payer: Self-pay | Admitting: Internal Medicine

## 2021-01-08 DIAGNOSIS — R072 Precordial pain: Secondary | ICD-10-CM | POA: Diagnosis not present

## 2021-01-08 DIAGNOSIS — I10 Essential (primary) hypertension: Secondary | ICD-10-CM | POA: Diagnosis not present

## 2021-01-08 DIAGNOSIS — I2510 Atherosclerotic heart disease of native coronary artery without angina pectoris: Secondary | ICD-10-CM | POA: Diagnosis not present

## 2021-01-14 ENCOUNTER — Other Ambulatory Visit (INDEPENDENT_AMBULATORY_CARE_PROVIDER_SITE_OTHER): Payer: Medicare HMO

## 2021-01-14 ENCOUNTER — Other Ambulatory Visit: Payer: Self-pay

## 2021-01-14 DIAGNOSIS — Z1329 Encounter for screening for other suspected endocrine disorder: Secondary | ICD-10-CM

## 2021-01-14 DIAGNOSIS — R7303 Prediabetes: Secondary | ICD-10-CM | POA: Diagnosis not present

## 2021-01-14 DIAGNOSIS — E785 Hyperlipidemia, unspecified: Secondary | ICD-10-CM | POA: Diagnosis not present

## 2021-01-14 DIAGNOSIS — I10 Essential (primary) hypertension: Secondary | ICD-10-CM

## 2021-01-14 DIAGNOSIS — Z125 Encounter for screening for malignant neoplasm of prostate: Secondary | ICD-10-CM | POA: Diagnosis not present

## 2021-01-14 DIAGNOSIS — Z1389 Encounter for screening for other disorder: Secondary | ICD-10-CM

## 2021-01-14 LAB — LIPID PANEL
Cholesterol: 172 mg/dL (ref 0–200)
HDL: 33.2 mg/dL — ABNORMAL LOW (ref >39.00)
NonHDL: 138.7
Total CHOL/HDL Ratio: 5
Triglycerides: 236 mg/dL — ABNORMAL HIGH (ref 0.0–149.0)
VLDL: 47.2 mg/dL — ABNORMAL HIGH (ref 0.0–40.0)

## 2021-01-14 LAB — CBC WITH DIFFERENTIAL/PLATELET
Basophils Absolute: 0 10*3/uL (ref 0.0–0.1)
Basophils Relative: 0.2 % (ref 0.0–3.0)
Eosinophils Absolute: 0.1 10*3/uL (ref 0.0–0.7)
Eosinophils Relative: 1.9 % (ref 0.0–5.0)
HCT: 42.2 % (ref 39.0–52.0)
Hemoglobin: 14.8 g/dL (ref 13.0–17.0)
Lymphocytes Relative: 34.2 % (ref 12.0–46.0)
Lymphs Abs: 1.9 10*3/uL (ref 0.7–4.0)
MCHC: 35.1 g/dL (ref 30.0–36.0)
MCV: 95.3 fl (ref 78.0–100.0)
Monocytes Absolute: 0.4 10*3/uL (ref 0.1–1.0)
Monocytes Relative: 7.5 % (ref 3.0–12.0)
Neutro Abs: 3.1 10*3/uL (ref 1.4–7.7)
Neutrophils Relative %: 56.2 % (ref 43.0–77.0)
Platelets: 218 10*3/uL (ref 150.0–400.0)
RBC: 4.43 Mil/uL (ref 4.22–5.81)
RDW: 12.8 % (ref 11.5–15.5)
WBC: 5.6 10*3/uL (ref 4.0–10.5)

## 2021-01-14 LAB — COMPREHENSIVE METABOLIC PANEL
ALT: 40 U/L (ref 0–53)
AST: 20 U/L (ref 0–37)
Albumin: 4.3 g/dL (ref 3.5–5.2)
Alkaline Phosphatase: 61 U/L (ref 39–117)
BUN: 16 mg/dL (ref 6–23)
CO2: 27 mEq/L (ref 19–32)
Calcium: 9.4 mg/dL (ref 8.4–10.5)
Chloride: 102 mEq/L (ref 96–112)
Creatinine, Ser: 1.01 mg/dL (ref 0.40–1.50)
GFR: 82.57 mL/min (ref >60.00)
Glucose, Bld: 122 mg/dL — ABNORMAL HIGH (ref 70–99)
Potassium: 4 mEq/L (ref 3.5–5.1)
Sodium: 138 mEq/L (ref 135–145)
Total Bilirubin: 0.8 mg/dL (ref 0.2–1.2)
Total Protein: 6.9 g/dL (ref 6.0–8.3)

## 2021-01-14 LAB — PSA: PSA: 0.93 ng/mL (ref 0.10–4.00)

## 2021-01-14 LAB — HEMOGLOBIN A1C: Hgb A1c MFr Bld: 5.9 % (ref 4.6–6.5)

## 2021-01-14 LAB — TSH: TSH: 2.84 u[IU]/mL (ref 0.35–4.50)

## 2021-01-14 LAB — LDL CHOLESTEROL, DIRECT: Direct LDL: 110 mg/dL

## 2021-01-14 NOTE — Addendum Note (Signed)
Addended by: Warden Fillers on: 01/14/2021 08:58 AM   Modules accepted: Orders

## 2021-01-14 NOTE — Addendum Note (Signed)
Addended by: Hulan Fray on: 01/14/2021 08:56 AM   Modules accepted: Orders

## 2021-01-15 LAB — URINALYSIS, ROUTINE W REFLEX MICROSCOPIC
Bilirubin, UA: NEGATIVE
Glucose, UA: NEGATIVE
Ketones, UA: NEGATIVE
Leukocytes,UA: NEGATIVE
Nitrite, UA: NEGATIVE
Protein,UA: NEGATIVE
RBC, UA: NEGATIVE
Specific Gravity, UA: 1.009 (ref 1.005–1.030)
Urobilinogen, Ur: 0.2 mg/dL (ref 0.2–1.0)
pH, UA: 5.5 (ref 5.0–7.5)

## 2021-01-20 ENCOUNTER — Telehealth: Payer: Self-pay | Admitting: Internal Medicine

## 2021-01-20 NOTE — Telephone Encounter (Signed)
Patient was returning call for results 

## 2021-01-21 NOTE — Telephone Encounter (Signed)
Left message to return call 

## 2021-01-21 NOTE — Telephone Encounter (Signed)
Tilford Pillar, CMA  01/19/2021 10:57 AM EDT Back to Top     Patient has seen results on mychart. Left message to return call with any further questions and medication choice.    Pasty Spillers McLean-Scocuzza, MD  01/15/2021  7:55 AM EDT      Triglycerides improved still not at goal <150  -does he want to add zetia for better cholesterol control ? Goal LDL <70   Liver kidneys normal  A1C still prediabetic range  Thyroid lab normal  Psa normal  Blood cts normal  Urine normal

## 2021-01-23 ENCOUNTER — Encounter: Payer: Self-pay | Admitting: Internal Medicine

## 2021-01-23 ENCOUNTER — Ambulatory Visit (INDEPENDENT_AMBULATORY_CARE_PROVIDER_SITE_OTHER): Payer: Medicare HMO | Admitting: Internal Medicine

## 2021-01-23 ENCOUNTER — Other Ambulatory Visit: Payer: Self-pay

## 2021-01-23 VITALS — BP 124/80 | HR 71 | Temp 98.0°F | Ht 70.0 in | Wt 298.4 lb

## 2021-01-23 DIAGNOSIS — M87051 Idiopathic aseptic necrosis of right femur: Secondary | ICD-10-CM

## 2021-01-23 DIAGNOSIS — M79651 Pain in right thigh: Secondary | ICD-10-CM

## 2021-01-23 DIAGNOSIS — R7303 Prediabetes: Secondary | ICD-10-CM | POA: Diagnosis not present

## 2021-01-23 DIAGNOSIS — M47816 Spondylosis without myelopathy or radiculopathy, lumbar region: Secondary | ICD-10-CM

## 2021-01-23 DIAGNOSIS — G8929 Other chronic pain: Secondary | ICD-10-CM

## 2021-01-23 DIAGNOSIS — M25551 Pain in right hip: Secondary | ICD-10-CM | POA: Diagnosis not present

## 2021-01-23 DIAGNOSIS — M5441 Lumbago with sciatica, right side: Secondary | ICD-10-CM

## 2021-01-23 DIAGNOSIS — Z0000 Encounter for general adult medical examination without abnormal findings: Secondary | ICD-10-CM | POA: Insufficient documentation

## 2021-01-23 MED ORDER — METHOCARBAMOL 500 MG PO TABS: 500.0000 mg | ORAL_TABLET | Freq: Three times a day (TID) | ORAL | 3 refills | Status: DC | PRN

## 2021-01-23 MED ORDER — OXYCODONE-ACETAMINOPHEN 7.5-325 MG PO TABS: 1.0000 | ORAL_TABLET | Freq: Two times a day (BID) | ORAL | 0 refills | Status: DC | PRN

## 2021-01-23 NOTE — Telephone Encounter (Signed)
Patient seen in person today, 01/23/21, and discussed results in office

## 2021-01-23 NOTE — Progress Notes (Signed)
Chief Complaint  Patient presents with  . Follow-up   Annual  1. AVN right hip eversion helps had numbing medication injection 01/01/21 10/10 pain and low back pain wants refill of robaxin    Review of Systems  Constitutional: Negative for weight loss.  HENT: Negative for hearing loss.   Eyes: Negative for blurred vision.  Respiratory: Negative for shortness of breath.   Cardiovascular: Negative for chest pain.  Gastrointestinal: Negative for abdominal pain.  Musculoskeletal: Positive for back pain and joint pain.  Skin: Negative for rash.  Neurological: Negative for headaches.  Psychiatric/Behavioral: Negative for depression.   Past Medical History:  Diagnosis Date  . Anginal pain (HCC)   . CAD (coronary artery disease)    s/p stent Dr. Algie Coffer; in 40s stents  . Difficult intubation   . Diverticulitis   . Dysrhythmia   . HLD (hyperlipidemia)   . Hypertension   . Myocardial infarction (HCC)     Feb.2008  . Obesity   . Pneumothorax 2006  . Sleep apnea    CPAP   Past Surgical History:  Procedure Laterality Date  . CARDIAC CATHETERIZATION     with stents  . PLEURAL SCARIFICATION Left 2006  . SHOULDER ARTHROSCOPY WITH OPEN ROTATOR CUFF REPAIR Left 10/12/2017   Procedure: SHOULDER ARTHROSCOPY WITH OPEN ROTATOR CUFF REPAIR;  Surgeon: Deeann Saint, MD;  Location: [facility] ORS;  Service: Orthopedics;  Laterality: Left;  Left shoulder arthroscopy, subaccromial decompression, distal clavical resection and biceps tenotomy  . shoulder joint replacement Left    surgery x 2 Dr. Andrey Campanile ortho  . TONSILLECTOMY     Family History  Problem Relation Age of Onset  . Depression Mother   . Anemia Mother   . Thyroid disease Mother   . Huntington's disease Mother   . CAD Father        s/p bypass   . Obesity Father   . Huntington's disease Sister   . Depression Sister   . Schizophrenia Brother   . Insomnia Brother   . Colon cancer Neg Hx   . Esophageal cancer Neg Hx   . Pancreatic  cancer Neg Hx   . Stomach cancer Neg Hx   . Liver disease Neg Hx    Social History   Socioeconomic History  . Marital status: Married    Spouse name: Not on file  . Number of children: Not on file  . Years of education: Not on file  . Highest education level: Not on file  Occupational History  . Not on file  Tobacco Use  . Smoking status: Current Some Day Smoker    Types: Cigarettes    Last attempt to quit: 10/07/2010    Years since quitting: 10.3  . Smokeless tobacco: Never Used  Vaping Use  . Vaping Use: Never used  Substance and Sexual Activity  . Alcohol use: Yes    Alcohol/week: 6.0 standard drinks    Types: 3 Glasses of wine, 3 Cans of beer per week    Comment: daily  . Drug use: No  . Sexual activity: Yes    Partners: Male  Other Topics Concern  . 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 albuterol (ACCUNEB) 0.63 MG/3ML nebulizer solution Take 3 mLs (0.63 mg total) by nebulization every 6 (six) hours as needed for wheezing.  Marland Kitchen. amLODipine (NORVASC) 10 MG tablet Take 1 tablet (10 mg total) by mouth daily.  Marland Kitchen. aspirin EC 81 MG tablet Take 1 tablet (81 mg total) by mouth daily.  Marland Kitchen. atorvastatin (LIPITOR) 40 MG tablet Take 1 tablet (40 mg total) by mouth daily at 6 PM.  . CALCIUM PO Take by mouth.  . Cholecalciferol (VITAMIN D3) 50 MCG (2000 UT) TABS Take 6,000 tablets by mouth.  . Ginkgo Biloba 40 MG TABS Take 1 tablet by mouth daily.  Marland Kitchen. losartan (COZAAR) 50 MG tablet Take  1 tablet (50 mg total) by mouth daily.  . metoprolol succinate (TOPROL-XL) 25 MG 24 hr tablet Take 1 tablet (25 mg total) by mouth daily.  . Multiple Vitamin (MULTIVITAMIN WITH MINERALS) TABS tablet Take 1 tablet by mouth daily.  . Omega-3 Fatty Acids (FISH OIL PO) Take by mouth.  . [DISCONTINUED] chlorzoxazone (PARAFON) 500 MG tablet Take 750 mg by mouth 2 (two) times daily as needed.  . [DISCONTINUED] clopidogrel (PLAVIX) 75 MG tablet Take 1 tablet (75 mg total) by mouth daily. Further refills Dr. Algie CofferKadakia  . [DISCONTINUED] methocarbamol (ROBAXIN) 500 MG tablet Take 500 mg by mouth 4 (four) times daily.  . [DISCONTINUED] oxyCODONE-acetaminophen (PERCOCET) 7.5-325 MG tablet Take 1 tablet by mouth 2 (two) times daily as needed for severe pain.   Allergies  Allergen Reactions  . No Known Allergies    Recent Results (from the past 2160 hour(s))  Cologuard     Status: None   Collection Time: 11/03/20 12:00 AM  Result Value Ref Range   Cologuard Negative Negative  Cologuard     Status: None   Collection Time: 11/03/20 12:00 AM  Result Value Ref Range   Cologuard Negative Negative  Compliance Drug Analysis, Ur     Status: None   Collection Time: 11/27/20  4:00 PM  Result Value Ref Range   Summary Note     Comment: ==================================================================== Compliance Drug Analysis, Ur ==================================================================== Test                             Result       Flag       Units  Drug Present not Declared for Prescription Verification   Carboxy-THC                    339          UNEXPECTED ng/mg creat    Carboxy-THC is a metabolite of tetrahydrocannabinol (THC). Source of    THC is most commonly herbal marijuana or marijuana-based products,    but THC is also present in a scheduled prescription medication.    Trace amounts of THC can be present in hemp and cannabidiol (CBD)    products. This test is not intended to  distinguish between delta-9-    tetrahydrocannabinol, the predominant form of THC in most herbal or    marijuana-based products, and delta-8-tetrahydrocannabinol.    O-Desmethyltramadol            105  UNEXPECTED ng/mg creat    O-desmethyltramadol is an expected metabolite of tramadol. Source of     tramadol is a prescription medication.  Drug Absent but Declared for Prescription Verification   Oxycodone                      Not Detected UNEXPECTED ng/mg creat   Methocarbamol                  Not Detected UNEXPECTED   Acetaminophen                  Not Detected UNEXPECTED    Acetaminophen, as indicated in the declared medication list, is not    always detected even when used as directed.    Salicylate                     Not Detected UNEXPECTED    Aspirin, as indicated in the declared medication list, is not always    detected even when used as directed.    Metoprolol                     Not Detected UNEXPECTED ==================================================================== Test                      Result    Flag   Units      Ref Range   Creatinine              75               mg/dL      >=56 ==================================================================== Declared Medications:  The flagging and interpretation on this report are based on the  follow ing declared medications.  Unexpected results may arise from  inaccuracies in the declared medications.   **Note: The testing scope of this panel includes these medications:   Methocarbamol (Robaxin)  Metoprolol (Toprol)  Oxycodone (Percocet)   **Note: The testing scope of this panel does not include small to  moderate amounts of these reported medications:   Acetaminophen (Percocet)  Aspirin   **Note: The testing scope of this panel does not include the  following reported medications:   Albuterol  Amlodipine (Norvasc)  Atorvastatin (Lipitor)  Clopidogrel (Plavix)  Losartan (Cozaar)  Multivitamin   Omega-3 Fatty Acids  Supplement  Vitamin D3 ==================================================================== For clinical consultation, please call [phone]. ====================================================================   TSH     Status: None   Collection Time: 01/14/21  9:04 AM  Result Value Ref Range   TSH 2.84 0.35 - 4.50 uIU/mL  PSA     Status: None   Collection Time: 01/14/21  9:04 AM  Result Value Ref Range   PSA 0.93 0.10 - 4.00 ng/mL    Comment: Test performed using Access Hybritech PSA Assay, a parmagnetic partical, chemiluminecent immunoassay.  Lipid panel     Status: Abnormal   Collection Time: 01/14/21  9:04 AM  Result Value Ref Range   Cholesterol 172 0 - 200 mg/dL    Comment: ATP III Classification       Desirable:  < 200 mg/dL               Borderline High:  200 - 239 mg/dL          High:  > = 696 mg/dL   Triglycerides 295.2 (H) 0.0 - 149.0 mg/dL    Comment: Normal:  <841 mg/dLBorderline High:  150 - 199 mg/dL  HDL 33.20 (L) >39.00 mg/dL   VLDL 59.1 (H) 0.0 - 63.8 mg/dL   Total CHOL/HDL Ratio 5     Comment:                Men          Women1/2 Average Risk     3.4          3.3Average Risk          5.0          4.42X Average Risk          9.6          7.13X Average Risk          15.0          11.0                       NonHDL 138.70     Comment: NOTE:  Non-HDL goal should be 30 mg/dL higher than patient's LDL goal (i.e. LDL goal of < 70 mg/dL, would have non-HDL goal of < 100 mg/dL)  Hemoglobin G6K     Status: None   Collection Time: 01/14/21  9:04 AM  Result Value Ref Range   Hgb A1c MFr Bld 5.9 4.6 - 6.5 %    Comment: Glycemic Control Guidelines for People with Diabetes:Non Diabetic:  <6%Goal of Therapy: <7%Additional Action Suggested:  >8%   Comprehensive metabolic panel     Status: Abnormal   Collection Time: 01/14/21  9:04 AM  Result Value Ref Range   Sodium 138 135 - 145 mEq/L   Potassium 4.0 3.5 - 5.1 mEq/L   Chloride 102 96 - 112 mEq/L    CO2 27 19 - 32 mEq/L   Glucose, Bld 122 (H) 70 - 99 mg/dL   BUN 16 6 - 23 mg/dL   Creatinine, Ser 5.99 0.40 - 1.50 mg/dL   Total Bilirubin 0.8 0.2 - 1.2 mg/dL   Alkaline Phosphatase 61 39 - 117 U/L   AST 20 0 - 37 U/L   ALT 40 0 - 53 U/L   Total Protein 6.9 6.0 - 8.3 g/dL   Albumin 4.3 3.5 - 5.2 g/dL   GFR 35.70 >17.79 mL/min    Comment: Calculated using the CKD-EPI Creatinine Equation (2021)   Calcium 9.4 8.4 - 10.5 mg/dL  CBC with Differential/Platelet     Status: None   Collection Time: 01/14/21  9:04 AM  Result Value Ref Range   WBC 5.6 4.0 - 10.5 K/uL   RBC 4.43 4.22 - 5.81 Mil/uL   Hemoglobin 14.8 13.0 - 17.0 g/dL   HCT 39.0 30.0 - 92.3 %   MCV 95.3 78.0 - 100.0 fl   MCHC 35.1 30.0 - 36.0 g/dL   RDW 30.0 76.2 - 26.3 %   Platelets 218.0 150.0 - 400.0 K/uL   Neutrophils Relative % 56.2 43.0 - 77.0 %   Lymphocytes Relative 34.2 12.0 - 46.0 %   Monocytes Relative 7.5 3.0 - 12.0 %   Eosinophils Relative 1.9 0.0 - 5.0 %   Basophils Relative 0.2 0.0 - 3.0 %   Neutro Abs 3.1 1.4 - 7.7 K/uL   Lymphs Abs 1.9 0.7 - 4.0 K/uL   Monocytes Absolute 0.4 0.1 - 1.0 K/uL   Eosinophils Absolute 0.1 0.0 - 0.7 K/uL   Basophils Absolute 0.0 0.0 - 0.1 K/uL  LDL cholesterol, direct     Status: None   Collection Time: 01/14/21  9:04 AM  Result Value Ref Range   Direct LDL 110.0 mg/dL    Comment: Optimal:  <381 mg/dLNear or Above Optimal:  100-129 mg/dLBorderline High:  130-159 mg/dLHigh:  160-189 mg/dLVery High:  >190 mg/dL  Urinalysis, Routine w reflex microscopic     Status: None   Collection Time: 01/14/21 10:00 AM  Result Value Ref Range   Specific Gravity, UA 1.009 1.005 - 1.030   pH, UA 5.5 5.0 - 7.5   Color, UA Yellow Yellow   Appearance Ur Clear Clear   Leukocytes,UA Negative Negative   Protein,UA Negative Negative/Trace   Glucose, UA Negative Negative   Ketones, UA Negative Negative   RBC, UA Negative Negative   Bilirubin, UA Negative Negative   Urobilinogen, Ur 0.2 0.2 - 1.0  mg/dL   Nitrite, UA Negative Negative   Microscopic Examination Comment     Comment: Microscopic not indicated and not performed.   Objective  Body mass index is 42.82 kg/m. Wt Readings from Last 3 Encounters:  01/23/21 298 lb 6.4 oz (135.4 kg)  12/17/20 295 lb (133.8 kg)  11/27/20 295 lb (133.8 kg)   Temp Readings from Last 3 Encounters:  01/23/21 98 F (36.7 C) (Oral)  11/27/20 (!) 97.3 F (36.3 C)  10/22/20 98.8 F (37.1 C) (Oral)   BP Readings from Last 3 Encounters:  01/23/21 124/80  12/17/20 128/78  11/27/20 (!) 145/99   Pulse Readings from Last 3 Encounters:  01/23/21 71  11/27/20 83  10/22/20 78    Physical Exam Vitals and nursing note reviewed.  Constitutional:      Appearance: Normal appearance. He is well-developed and well-groomed. He is obese.  HENT:     Head: Normocephalic and atraumatic.  Eyes:     Conjunctiva/sclera: Conjunctivae normal.     Pupils: Pupils are equal, round, and reactive to light.  Cardiovascular:     Rate and Rhythm: Normal rate and regular rhythm.     Heart sounds: Normal heart sounds. No murmur heard.   Pulmonary:     Effort: Pulmonary effort is normal.     Breath sounds: Normal breath sounds.  Abdominal:     Tenderness: There is no abdominal tenderness.  Skin:    General: Skin is warm and dry.  Neurological:     General: No focal deficit present.     Mental Status: He is alert and oriented to person, place, and time. Mental status is at baseline.     Gait: Gait normal.  Psychiatric:        Attention and Perception: Attention and perception normal.        Mood and Affect: Mood and affect normal.        Speech: Speech normal.        Behavior: Behavior normal. Behavior is cooperative.        Thought Content: Thought content normal.        Cognition and Memory: Cognition and memory normal.        Judgment: Judgment normal.     Assessment  Plan  Annual physical exam Flu shot hadutd Tdaputd3/16/21 Consider  shingrix vaccine  covid 19 vx 2/2utd pfizer 10/19/20(3/3)  normalPSA 01/14/21 0.93    Colonoscopy referred leb Gi Dr. Gildardo Cranker 12/2019 pt never scheduled given # to call for future 11/03/20 negative cologuard   Skinreferred dermpreviously Dr. Roseanne Reno in 2021/2022 skin tags removed doing well as of 12/17/20   Smoker since age 9 y.o light per pt always <1 ppd on and off 1 pk lasting 1-3  months x 45 years on and off   Consider testing for HD in future mother died of HD, 1/2 has gene as well CardiologyDr. ZOXWRUEAV of 12/17/20 has not seen   rec healthy diet and exercise  AVN b/l hips R>L [facility] ortho Dr. Lyda Kalata appt 01/01/21 does not want surgery consider nonsurgical tx's  Mri right hip  IMPRESSION:   1. Avascular necrosis in the right femoral head with mild reactive edema in the head/neck, and a moderate joint effusion.  2. Small focus of chronic avascular necrosis in the left femoral head, without associated edema or effusion.   01/01/21 lidocaine/ropivcaine shot right hip   rec healthy diet and exercise   Chronic right-sided low back pain with right-sided sciatica - Plan: oxyCODONE-acetaminophen (PERCOCET) 7.5-325 MG tablet, methocarbamol (ROBAXIN) 500 MG tablet Right hip pain - Plan: oxyCODONE-acetaminophen (PERCOCET) 7.5-325 MG tablet Right thigh pain - Plan: oxyCODONE-acetaminophen (PERCOCET) 7.5-325 MG tablet vascular necrosis of bone of hip, right (HCC) - Plan: oxyCODONE-acetaminophen (PERCOCET) 7.5-325 MG tablet Arthritis of facet joint of lumbar spine - Plan: oxyCODONE-acetaminophen (PERCOCET) 7.5-325 MG tablet, methocarbamol (ROBAXIN) 500 MG tablet  Prediabetes A1C 5.9   Provider: Dr. French Ana McLean-Scocuzza-Internal Medicine

## 2021-01-23 NOTE — Patient Instructions (Addendum)
Collagen  Nature made/nature wise tumeric  coq10  Consider zetia with lipitor to get LDL (bad) cholesterol <70     Gastroesophageal Reflux Disease, Adult--> tums/rolaids/nexiun/prilosec  Gastroesophageal reflux (GER) happens when acid from the stomach flows up into the tube that connects the mouth and the stomach (esophagus). Normally, food travels down the esophagus and stays in the stomach to be digested. However, when a person has GER, food and stomach acid sometimes move back up into the esophagus. If this becomes a more serious problem, the person may be diagnosed with a disease called gastroesophageal reflux disease (GERD). GERD occurs when the reflux:  Happens often.  Causes frequent or severe symptoms.  Causes problems such as damage to the esophagus. When stomach acid comes in contact with the esophagus, the acid may cause inflammation in the esophagus. Over time, GERD may create small holes (ulcers) in the lining of the esophagus. What are the causes? This condition is caused by a problem with the muscle between the esophagus and the stomach (lower esophageal sphincter, or LES). Normally, the LES muscle closes after food passes through the esophagus to the stomach. When the LES is weakened or abnormal, it does not close properly, and that allows food and stomach acid to go back up into the esophagus. The LES can be weakened by certain dietary substances, medicines, and medical conditions, including:  Tobacco use.  Pregnancy.  Having a hiatal hernia.  Alcohol use.  Certain foods and beverages, such as coffee, chocolate, onions, and peppermint. What increases the risk? You are more likely to develop this condition if you:  Have an increased body weight.  Have a connective tissue disorder.  Take NSAIDs, such as ibuprofen. What are the signs or symptoms? Symptoms of this condition include:  Heartburn.  Difficult or painful swallowing and the feeling of having a lump  in the throat.  A bitter taste in the mouth.  Bad breath and having a large amount of saliva.  Having an upset or bloated stomach and belching.  Chest pain. Different conditions can cause chest pain. Make sure you see your health care provider if you experience chest pain.  Shortness of breath or wheezing.  Ongoing (chronic) cough or a nighttime cough.  Wearing away of tooth enamel.  Weight loss. How is this diagnosed? This condition may be diagnosed based on a medical history and a physical exam. To determine if you have mild or severe GERD, your health care provider may also monitor how you respond to treatment. You may also have tests, including:  A test to examine your stomach and esophagus with a small camera (endoscopy).  A test that measures the acidity level in your esophagus.  A test that measures how much pressure is on your esophagus.  A barium swallow or modified barium swallow test to show the shape, size, and functioning of your esophagus. How is this treated? Treatment for this condition may vary depending on how severe your symptoms are. Your health care provider may recommend:  Changes to your diet.  Medicine.  Surgery. The goal of treatment is to help relieve your symptoms and to prevent complications. Follow these instructions at home: Eating and drinking  Follow a diet as recommended by your health care provider. This may involve avoiding foods and drinks such as: ? Coffee and tea, with or without caffeine. ? Drinks that contain alcohol. ? Energy drinks and sports drinks. ? Carbonated drinks or sodas. ? Chocolate and cocoa. ? Peppermint and mint flavorings. ?  Garlic and onions. ? Horseradish. ? Spicy and acidic foods, including peppers, chili powder, curry powder, vinegar, hot sauces, and barbecue sauce. ? Citrus fruit juices and citrus fruits, such as oranges, lemons, and limes. ? Tomato-based foods, such as red sauce, chili, salsa, and pizza  with red sauce. ? Fried and fatty foods, such as donuts, french fries, potato chips, and high-fat dressings. ? High-fat meats, such as hot dogs and fatty cuts of red and white meats, such as rib eye steak, sausage, ham, and bacon. ? High-fat dairy items, such as whole milk, butter, and cream cheese.  Eat small, frequent meals instead of large meals.  Avoid drinking large amounts of liquid with your meals.  Avoid eating meals during the 2-3 hours before bedtime.  Avoid lying down right after you eat.  Do not exercise right after you eat.   Lifestyle  Do not use any products that contain nicotine or tobacco. These products include cigarettes, chewing tobacco, and vaping devices, such as e-cigarettes. If you need help quitting, ask your health care provider.  Try to reduce your stress by using methods such as yoga or meditation. If you need help reducing stress, ask your health care provider.  If you are overweight, reduce your weight to an amount that is healthy for you. Ask your health care provider for guidance about a safe weight loss goal.   General instructions  Pay attention to any changes in your symptoms.  Take over-the-counter and prescription medicines only as told by your health care provider. Do not take aspirin, ibuprofen, or other NSAIDs unless your health care provider told you to take these medicines.  Wear loose-fitting clothing. Do not wear anything tight around your waist that causes pressure on your abdomen.  Raise (elevate) the head of your bed about 6 inches (15 cm). You can use a wedge to do this.  Avoid bending over if this makes your symptoms worse.  Keep all follow-up visits. This is important. Contact a health care provider if:  You have: ? New symptoms. ? Unexplained weight loss. ? Difficulty swallowing or it hurts to swallow. ? Wheezing or a persistent cough. ? A hoarse voice.  Your symptoms do not improve with treatment. Get help right away  if:  You have sudden pain in your arms, neck, jaw, teeth, or back.  You suddenly feel sweaty, dizzy, or light-headed.  You have chest pain or shortness of breath.  You vomit and the vomit is green, yellow, or black, or it looks like blood or coffee grounds.  You faint.  You have stool that is red, bloody, or black.  You cannot swallow, drink, or eat. These symptoms may represent a serious problem that is an emergency. Do not wait to see if the symptoms will go away. Get medical help right away. Call your local emergency services (911 in the [place] Do not drive yourself to the hospital. Summary  Gastroesophageal reflux happens when acid from the stomach flows up into the esophagus. GERD is a disease in which the reflux happens often, causes frequent or severe symptoms, or causes problems such as damage to the esophagus.  Treatment for this condition may vary depending on how severe your symptoms are. Your health care provider may recommend diet and lifestyle changes, medicine, or surgery.  Contact a health care provider if you have new or worsening symptoms.  Take over-the-counter and prescription medicines only as told by your health care provider. Do not take aspirin, ibuprofen, or other NSAIDs  unless your health care provider told you to do so.  Keep all follow-up visits as told by your health care provider. This is important. This information is not intended to replace advice given to you by your health care provider. Make sure you discuss any questions you have with your health care provider. Document Revised: 04/28/2020 Document Reviewed: 04/28/2020 Elsevier Patient Education  2021 [employer]  Food Choices for Gastroesophageal Reflux Disease, Adult When you have gastroesophageal reflux disease (GERD), the foods you eat and your eating habits are very important. Choosing the right foods can help ease the discomfort of GERD. Consider working with a dietitian to help you make  healthy food choices. What are tips for following this plan? Reading food labels  Look for foods that are low in saturated fat. Foods that have less than 5% of daily value (DV) of fat and 0 g of trans fats may help with your symptoms. Cooking  Cook foods using methods other than frying. This may include baking, steaming, grilling, or broiling. These are all methods that do not need a lot of fat for cooking.  To add flavor, try to use herbs that are low in spice and acidity. Meal planning  Choose healthy foods that are low in fat, such as fruits, vegetables, whole grains, low-fat dairy products, lean meats, fish, and poultry.  Eat frequent, small meals instead of three large meals each day. Eat your meals slowly, in a relaxed setting. Avoid bending over or lying down until 2-3 hours after eating.  Limit high-fat foods such as fatty meats or fried foods.  Limit your intake of fatty foods, such as oils, butter, and shortening.  Avoid the following as told by your health care provider: ? Foods that cause symptoms. These may be different for different people. Keep a food diary to keep track of foods that cause symptoms. ? Alcohol. ? Drinking large amounts of liquid with meals. ? Eating meals during the 2-3 hours before bed.   Lifestyle  Maintain a healthy weight. Ask your health care provider what weight is healthy for you. If you need to lose weight, work with your health care provider to do so safely.  Exercise for at least 30 minutes on 5 or more days each week, or as told by your health care provider.  Avoid wearing clothes that fit tightly around your waist and chest.  Do not use any products that contain nicotine or tobacco. These products include cigarettes, chewing tobacco, and vaping devices, such as e-cigarettes. If you need help quitting, ask your health care provider.  Sleep with the head of your bed raised. Use a wedge under the mattress or blocks under the bed frame to  raise the head of the bed.  Chew sugar-free gum after mealtimes. What foods should I eat? Eat a healthy, well-balanced diet of fruits, vegetables, whole grains, low-fat dairy products, lean meats, fish, and poultry. Each person is different. Foods that may trigger symptoms in one person may not trigger any symptoms in another person. Work with your health care provider to identify foods that are safe for you. The items listed above may not be a complete list of recommended foods and beverages. Contact a dietitian for more information.   What foods should I avoid? Limiting some of these foods may help manage the symptoms of GERD. Everyone is different. Consult a dietitian or your health care provider to help you identify the exact foods to avoid, if any. Fruits Any fruits prepared  with added fat. Any fruits that cause symptoms. For some people this may include citrus fruits, such as oranges, grapefruit, pineapple, and lemons. Vegetables Deep-fried vegetables. [place] fries. Any vegetables prepared with added fat. Any vegetables that cause symptoms. For some people, this may include tomatoes and tomato products, chili peppers, onions and garlic, and horseradish. Grains Pastries or quick breads with added fat. Meats and other proteins High-fat meats, such as fatty beef or pork, hot dogs, ribs, ham, sausage, salami, and bacon. Fried meat or protein, including fried fish and fried chicken. Nuts and nut butters, in large amounts. Dairy Whole milk and chocolate milk. Sour cream. Cream. Ice cream. Cream cheese. Milkshakes. Fats and oils Butter. Margarine. Shortening. Ghee. Beverages Coffee and tea, with or without caffeine. Carbonated beverages. Sodas. Energy drinks. Fruit juice made with acidic fruits, such as orange or grapefruit. Tomato juice. Alcoholic drinks. Sweets and desserts Chocolate and cocoa. Donuts. Seasonings and condiments Pepper. Peppermint and spearmint. Added salt. Any condiments,  herbs, or seasonings that cause symptoms. For some people, this may include curry, hot sauce, or vinegar-based salad dressings. The items listed above may not be a complete list of foods and beverages to avoid. Contact a dietitian for more information. Questions to ask your health care provider Diet and lifestyle changes are usually the first steps that are taken to manage symptoms of GERD. If diet and lifestyle changes do not improve your symptoms, talk with your health care provider about taking medicines. Where to find more information  [employer]: aboutgerd.org Summary  When you have gastroesophageal reflux disease (GERD), food and lifestyle choices may be very helpful in easing the discomfort of GERD.  Eat frequent, small meals instead of three large meals each day. Eat your meals slowly, in a relaxed setting. Avoid bending over or lying down until 2-3 hours after eating.  Limit high-fat foods such as fatty meats or fried foods. This information is not intended to replace advice given to you by your health care provider. Make sure you discuss any questions you have with your health care provider. Document Revised: 04/28/2020 Document Reviewed: 04/28/2020 Elsevier Patient Education  2021 [employer]   Hip Exercises Ask your health care provider which exercises are safe for you. Do exercises exactly as told by your health care provider and adjust them as directed. It is normal to feel mild stretching, pulling, tightness, or discomfort as you do these exercises. Stop right away if you feel sudden pain or your pain gets worse. Do not begin these exercises until told by your health care provider. Stretching and range-of-motion exercises These exercises warm up your muscles and joints and improve the movement and flexibility of your hip. These exercises also help to relieve pain, numbness, and tingling. You may be asked to limit your range of  motion if you had a hip replacement. Talk to your health care provider about these restrictions. Hamstrings, supine 1. Lie on your back (supine position). 2. Loop a belt or towel over the ball of your left / right foot. The ball of your foot is on the walking surface, right under your toes. 3. Straighten your left / right knee and slowly pull on the belt or towel to raise your leg until you feel a gentle stretch behind your knee (hamstring). ? Do not let your knee bend while you do this. ? Keep your other leg flat on the floor. 4. Hold this position for __________ seconds. 5. Slowly return your leg to  the starting position. Repeat __________ times. Complete this exercise __________ times a day.   Hip rotation 1. Lie on your back on a firm surface. 2. With your left / right hand, gently pull your left / right knee toward the shoulder that is on the same side of the body. Stop when your knee is pointing toward the ceiling. 3. Hold your left / right ankle with your other hand. 4. Keeping your knee steady, gently pull your left / right ankle toward your other shoulder until you feel a stretch in your buttocks. ? Keep your hips and shoulders firmly planted while you do this stretch. 5. Hold this position for __________ seconds. Repeat __________ times. Complete this exercise __________ times a day.   Seated stretch This exercise is sometimes called hamstrings and adductors stretch. 1. Sit on the floor with your legs stretched wide. Keep your knees straight during this exercise. 2. Keeping your head and back in a straight line, bend at your waist to reach for your left foot (position A). You should feel a stretch in your right inner thigh (adductors). 3. Hold this position for __________ seconds. Then slowly return to the upright position. 4. Keeping your head and back in a straight line, bend at your waist to reach forward (position B). You should feel a stretch behind both of your thighs and knees  (hamstrings). 5. Hold this position for __________ seconds. Then slowly return to the upright position. 6. Keeping your head and back in a straight line, bend at your waist to reach for your right foot (position C). You should feel a stretch in your left inner thigh (adductors). 7. Hold this position for __________ seconds. Then slowly return to the upright position. Repeat __________ times. Complete this exercise __________ times a day.   Lunge This exercise stretches the muscles of the hip (hip flexors). 1. Place your left / right knee on the floor and bend your other knee so that is directly over your ankle. You should be half-kneeling. 2. Keep good posture with your head over your shoulders. 3. Tighten your buttocks to point your tailbone downward. This will prevent your back from arching too much. 4. You should feel a gentle stretch in the front of your left / right thigh and hip. If you do not feel a stretch, slide your other foot forward slightly and then slowly lunge forward with your chest up until your knee once again lines up over your ankle. ? Make sure your tailbone continues to point downward. 5. Hold this position for __________ seconds. 6. Slowly return to the starting position. Repeat __________ times. Complete this exercise __________ times a day.   Strengthening exercises These exercises build strength and endurance in your hip. Endurance is the ability to use your muscles for a long time, even after they get tired. Bridge This exercise strengthens the muscles of your hip (hip extensors). 1. Lie on your back on a firm surface with your knees bent and your feet flat on the floor. 2. Tighten your buttocks muscles and lift your bottom off the floor until the trunk of your body and your hips are level with your thighs. ? Do not arch your back. ? You should feel the muscles working in your buttocks and the back of your thighs. If you do not feel these muscles, slide your feet 1-2  inches (2.5-5 cm) farther away from your buttocks. 3. Hold this position for __________ seconds. 4. Slowly lower your hips to the  starting position. 5. Let your muscles relax completely between repetitions. Repeat __________ times. Complete this exercise __________ times a day.   Straight leg raises, side-lying This exercise strengthens the muscles that move the hip joint away from the center of the body (hip abductors). 1. Lie on your side with your left / right leg in the top position. Lie so your head, shoulder, hip, and knee line up. You may bend your bottom knee slightly to help you balance. 2. Roll your hips slightly forward, so your hips are stacked directly over each other and your left / right knee is facing forward. 3. Leading with your heel, lift your top leg 4-6 inches (10-15 cm). You should feel the muscles in your top hip lifting. ? Do not let your foot drift forward. ? Do not let your knee roll toward the ceiling. 4. Hold this position for __________ seconds. 5. Slowly return to the starting position. 6. Let your muscles relax completely between repetitions. Repeat __________ times. Complete this exercise __________ times a day.   Straight leg raises, side-lying This exercise strengthens the muscles that move the hip joint toward the center of the body (hip adductors). 1. Lie on your side with your left / right leg in the bottom position. Lie so your head, shoulder, hip, and knee line up. You may place your upper foot in front to help you balance. 2. Roll your hips slightly forward, so your hips are stacked directly over each other and your left / right knee is facing forward. 3. Tense the muscles in your inner thigh and lift your bottom leg 4-6 inches (10-15 cm). 4. Hold this position for __________ seconds. 5. Slowly return to the starting position. 6. Let your muscles relax completely between repetitions. Repeat __________ times. Complete this exercise __________ times a  day.   Straight leg raises, supine This exercise strengthens the muscles in the front of your thigh (quadriceps). 1. Lie on your back (supine position) with your left / right leg extended and your other knee bent. 2. Tense the muscles in the front of your left / right thigh. You should see your kneecap slide up or see increased dimpling just above your knee. 3. Keep these muscles tight as you raise your leg 4-6 inches (10-15 cm) off the floor. Do not let your knee bend. 4. Hold this position for __________ seconds. 5. Keep these muscles tense as you lower your leg. 6. Relax the muscles slowly and completely between repetitions. Repeat __________ times. Complete this exercise __________ times a day.   Hip abductors, standing This exercise strengthens the muscles that move the leg and hip joint away from the center of the body (hip abductors). 1. Tie one end of a rubber exercise band or tubing to a secure surface, such as a chair, table, or pole. 2. Loop the other end of the band or tubing around your left / right ankle. 3. Keeping your ankle with the band or tubing directly opposite the secured end, step away until there is tension in the tubing or band. Hold on to a chair, table, or pole as needed for balance. 4. Lift your left / right leg out to your side. While you do this: ? Keep your back upright. ? Keep your shoulders over your hips. ? Keep your toes pointing forward. ? Make sure to use your hip muscles to slowly lift your leg. Do not tip your body or forcefully lift your leg. 5. Hold this position for __________  seconds. 6. Slowly return to the starting position. Repeat __________ times. Complete this exercise __________ times a day. Squats This exercise strengthens the muscles in the front of your thigh (quadriceps). 1. Stand in a door frame so your feet and knees are in line with the frame. You may place your hands on the frame for balance. 2. Slowly bend your knees and lower your  hips like you are going to sit in a chair. ? Keep your lower legs in a straight-up-and-down position. ? Do not let your hips go lower than your knees. ? Do not bend your knees lower than told by your health care provider. ? If your hip pain increases, do not bend as low. 3. Hold this position for ___________ seconds. 4. Slowly push with your legs to return to standing. Do not use your hands to pull yourself to standing. Repeat __________ times. Complete this exercise __________ times a day. This information is not intended to replace advice given to you by your health care provider. Make sure you discuss any questions you have with your health care provider. Document Revised: 05/24/2019 Document Reviewed: 08/29/2018 Elsevier Patient Education  2021 [employer]    High Cholesterol  High cholesterol is a condition in which the blood has high levels of a white, waxy substance similar to fat (cholesterol). The liver makes all the cholesterol that the body needs. The human body needs small amounts of cholesterol to help build cells. A person gets extra or excess cholesterol from the food that he or she eats. The blood carries cholesterol from the liver to the rest of the body. If you have high cholesterol, deposits (plaques) may build up on the walls of your arteries. Arteries are the blood vessels that carry blood away from your heart. These plaques make the arteries narrow and stiff. Cholesterol plaques increase your risk for heart attack and stroke. Work with your health care provider to keep your cholesterol levels in a healthy range. What increases the risk? The following factors may make you more likely to develop this condition:  Eating foods that are high in animal fat (saturated fat) or cholesterol.  Being overweight.  Not getting enough exercise.  A family history of high cholesterol (familial hypercholesterolemia).  Use of tobacco products.  Having diabetes. What are the  signs or symptoms? There are no symptoms of this condition. How is this diagnosed? This condition may be diagnosed based on the results of a blood test.  If you are older than 58 years of age, your health care provider may check your cholesterol levels every 4-6 years.  You may be checked more often if you have high cholesterol or other risk factors for heart disease. The blood test for cholesterol measures:  "Bad" cholesterol, or LDL cholesterol. This is the main type of cholesterol that causes heart disease. The desired level is less than 100 mg/dL.  "Good" cholesterol, or HDL cholesterol. HDL helps protect against heart disease by cleaning the arteries and carrying the LDL to the liver for processing. The desired level for HDL is 60 mg/dL or higher.  Triglycerides. These are fats that your body can store or burn for energy. The desired level is less than 150 mg/dL.  Total cholesterol. This measures the total amount of cholesterol in your blood and includes LDL, HDL, and triglycerides. The desired level is less than 200 mg/dL. How is this treated? This condition may be treated with:  Diet changes. You may be asked to eat foods  that have more fiber and less saturated fats or added sugar.  Lifestyle changes. These may include regular exercise, maintaining a healthy weight, and quitting use of tobacco products.  Medicines. These are given when diet and lifestyle changes have not worked. You may be prescribed a statin medicine to help lower your cholesterol levels. Follow these instructions at home: Eating and drinking  Eat a healthy, balanced diet. This diet includes: ? Daily servings of a variety of fresh, frozen, or canned fruits and vegetables. ? Daily servings of whole grain foods that are rich in fiber. ? Foods that are low in saturated fats and trans fats. These include poultry and fish without skin, lean cuts of meat, and low-fat dairy products. ? A variety of fish, especially  oily fish that contain omega-3 fatty acids. Aim to eat fish at least 2 times a week.  Avoid foods and drinks that have added sugar.  Use healthy cooking methods, such as roasting, grilling, broiling, baking, poaching, steaming, and stir-frying. Do not fry your food except for stir-frying.   Lifestyle  Get regular exercise. Aim to exercise for a total of 150 minutes a week. Increase your activity level by doing activities such as gardening, walking, and taking the stairs.  Do not use any products that contain nicotine or tobacco, such as cigarettes, e-cigarettes, and chewing tobacco. If you need help quitting, ask your health care provider.   General instructions  Take over-the-counter and prescription medicines only as told by your health care provider.  Keep all follow-up visits as told by your health care provider. This is important. Where to find more information  American Heart Association: www.heart.org  National Heart, Lung, and Blood Institute: PopSteam.is Contact a health care provider if:  You have trouble achieving or maintaining a healthy diet or weight.  You are starting an exercise program.  You are unable to stop smoking. Get help right away if:  You have chest pain.  You have trouble breathing.  You have any symptoms of a stroke. "BE FAST" is an easy way to remember the main warning signs of a stroke: ? B - Balance. Signs are dizziness, sudden trouble walking, or loss of balance. ? E - Eyes. Signs are trouble seeing or a sudden change in vision. ? F - Face. Signs are sudden weakness or numbness of the face, or the face or eyelid drooping on one side. ? A - Arms. Signs are weakness or numbness in an arm. This happens suddenly and usually on one side of the body. ? S - Speech. Signs are sudden trouble speaking, slurred speech, or trouble understanding what people say. ? T - Time. Time to call emergency services. Write down what time symptoms started.  You  have other signs of a stroke, such as: ? A sudden, severe headache with no known cause. ? Nausea or vomiting. ? Seizure. These symptoms may represent a serious problem that is an emergency. Do not wait to see if the symptoms will go away. Get medical help right away. Call your local emergency services (911 in the [place] Do not drive yourself to the hospital. Summary  Cholesterol plaques increase your risk for heart attack and stroke. Work with your health care provider to keep your cholesterol levels in a healthy range.  Eat a healthy, balanced diet, get regular exercise, and maintain a healthy weight.  Do not use any products that contain nicotine or tobacco, such as cigarettes, e-cigarettes, and chewing tobacco.  Get help right away  if you have any symptoms of a stroke. This information is not intended to replace advice given to you by your health care provider. Make sure you discuss any questions you have with your health care provider. Document Revised: 09/17/2019 Document Reviewed: 09/17/2019 Elsevier Patient Education  2021 Elsevier Inc.  Cholesterol Content in Foods Cholesterol is a waxy, fat-like substance that helps to carry fat in the blood. The body needs cholesterol in small amounts, but too much cholesterol can cause damage to the arteries and heart. Most people should eat less than 200 milligrams (mg) of cholesterol a day. Foods with cholesterol Cholesterol is found in animal-based foods, such as meat, seafood, and dairy. Generally, low-fat dairy and lean meats have less cholesterol than full-fat dairy and fatty meats. The milligrams of cholesterol per serving (mg per serving) of common cholesterol-containing foods are listed below. Meat and other proteins  Egg -- one large whole egg has 186 mg.  Veal shank -- 4 oz has 141 mg.  Lean ground [place] (93% lean) -- 4 oz has 118 mg.  Fat-trimmed lamb loin -- 4 oz has 106 mg.  Lean ground beef (90% lean) -- 4 oz has 100  mg.  Lobster -- 3.5 oz has 90 mg.  Pork loin chops -- 4 oz has 86 mg.  Canned salmon -- 3.5 oz has 83 mg.  Fat-trimmed beef top loin -- 4 oz has 78 mg.  Frankfurter -- 1 frank (3.5 oz) has 77 mg.  Crab -- 3.5 oz has 71 mg.  Roasted chicken without skin, white meat -- 4 oz has 66 mg.  Light bologna -- 2 oz has 45 mg.  Deli-cut [place] -- 2 oz has 31 mg.  Canned tuna -- 3.5 oz has 31 mg.  Tomasa Blase -- 1 oz has 29 mg.  Oysters and mussels (raw) -- 3.5 oz has 25 mg.  Mackerel -- 1 oz has 22 mg.  Trout -- 1 oz has 20 mg.  Pork sausage -- 1 link (1 oz) has 17 mg.  Salmon -- 1 oz has 16 mg.  Tilapia -- 1 oz has 14 mg. Dairy  Soft-serve ice cream --  cup (4 oz) has 103 mg.  Whole-milk yogurt -- 1 cup (8 oz) has 29 mg.  Cheddar cheese -- 1 oz has 28 mg.  American cheese -- 1 oz has 28 mg.  Whole milk -- 1 cup (8 oz) has 23 mg.  2% milk -- 1 cup (8 oz) has 18 mg.  Cream cheese -- 1 tablespoon (Tbsp) has 15 mg.  Cottage cheese --  cup (4 oz) has 14 mg.  Low-fat (1%) milk -- 1 cup (8 oz) has 10 mg.  Sour cream -- 1 Tbsp has 8.5 mg.  Low-fat yogurt -- 1 cup (8 oz) has 8 mg.  Nonfat Greek yogurt -- 1 cup (8 oz) has 7 mg.  Half-and-half cream -- 1 Tbsp has 5 mg. Fats and oils  Cod liver oil -- 1 tablespoon (Tbsp) has 82 mg.  Butter -- 1 Tbsp has 15 mg.  Lard -- 1 Tbsp has 14 mg.  Bacon grease -- 1 Tbsp has 14 mg.  Mayonnaise -- 1 Tbsp has 5-10 mg.  Margarine -- 1 Tbsp has 3-10 mg. Exact amounts of cholesterol in these foods may vary depending on specific ingredients and brands.   Foods without cholesterol Most plant-based foods do not have cholesterol unless you combine them with a food that has cholesterol. Foods without cholesterol include:  Grains and  cereals.  Vegetables.  Fruits.  Vegetable oils, such as olive, canola, and sunflower oil.  Legumes, such as peas, beans, and lentils.  Nuts and seeds.  Egg whites.   Summary  The body needs  cholesterol in small amounts, but too much cholesterol can cause damage to the arteries and heart.  Most people should eat less than 200 milligrams (mg) of cholesterol a day. This information is not intended to replace advice given to you by your health care provider. Make sure you discuss any questions you have with your health care provider. Document Revised: 03/10/2020 Document Reviewed: 03/10/2020 Elsevier Patient Education  2021 [employer]

## 2021-02-02 DIAGNOSIS — G4733 Obstructive sleep apnea (adult) (pediatric): Secondary | ICD-10-CM | POA: Diagnosis not present

## 2021-02-24 ENCOUNTER — Other Ambulatory Visit: Payer: Self-pay | Admitting: Internal Medicine

## 2021-02-24 DIAGNOSIS — M87051 Idiopathic aseptic necrosis of right femur: Secondary | ICD-10-CM

## 2021-02-24 DIAGNOSIS — M79651 Pain in right thigh: Secondary | ICD-10-CM

## 2021-02-24 DIAGNOSIS — M47816 Spondylosis without myelopathy or radiculopathy, lumbar region: Secondary | ICD-10-CM

## 2021-02-24 DIAGNOSIS — G8929 Other chronic pain: Secondary | ICD-10-CM

## 2021-02-24 DIAGNOSIS — M25551 Pain in right hip: Secondary | ICD-10-CM

## 2021-02-24 MED ORDER — OXYCODONE-ACETAMINOPHEN 7.5-325 MG PO TABS: 1.0000 | ORAL_TABLET | Freq: Two times a day (BID) | ORAL | 0 refills | Status: DC | PRN

## 2021-03-12 ENCOUNTER — Telehealth: Payer: Self-pay | Admitting: Internal Medicine

## 2021-03-12 NOTE — Telephone Encounter (Signed)
Left message for patient to call back and schedule Medicare Annual Wellness Visit (AWV) in office.   If not able to come in office, please offer to do virtually or by telephone.   DUE FOR AWVI  Please schedule at anytime with Nurse Health Advisor.   

## 2021-03-16 DIAGNOSIS — H43813 Vitreous degeneration, bilateral: Secondary | ICD-10-CM | POA: Diagnosis not present

## 2021-03-16 DIAGNOSIS — Z0100 Encounter for examination of eyes and vision without abnormal findings: Secondary | ICD-10-CM | POA: Diagnosis not present

## 2021-03-18 ENCOUNTER — Encounter: Payer: Self-pay | Admitting: Internal Medicine

## 2021-03-18 LAB — HM DIABETES EYE EXAM

## 2021-03-20 ENCOUNTER — Ambulatory Visit (INDEPENDENT_AMBULATORY_CARE_PROVIDER_SITE_OTHER): Payer: Medicare HMO

## 2021-03-20 VITALS — Ht 70.0 in | Wt 298.0 lb

## 2021-03-20 DIAGNOSIS — Z0000 Encounter for general adult medical examination without abnormal findings: Secondary | ICD-10-CM | POA: Diagnosis not present

## 2021-03-20 NOTE — Patient Instructions (Addendum)
[name] , Thank you for taking time to come for your Medicare Wellness Visit. I appreciate your ongoing commitment to your health goals. Please review the following plan we discussed and let me know if I can assist you in the future.   These are the goals we discussed: Goals      Patient Stated   .  Weight goal 245lb (pt-stated)      Increase physical activity with bike riding Healthy diet Decrease/stop alcohol intake        This is a list of the screening recommended for you and due dates:  Health Maintenance  Topic Date Due  . COVID-19 Vaccine (4 - Booster for Pfizer series) 01/17/2021  . Colon Cancer Screening  10/22/2021*  . Flu Shot  06/01/2021  . Tetanus Vaccine  01/14/2030  . Hepatitis C Screening: USPSTF Recommendation to screen - Ages 6418-79 yo.  Completed  . HIV Screening  Completed  . HPV Vaccine  Aged Out  *Topic was postponed. The date shown is not the original due date.    Immunizations Immunization History  Administered Date(s) Administered  . Influenza-Unspecified 07/01/2019, 08/05/2020  . PFIZER(Purple Top)SARS-COV-2 Vaccination 01/29/2020, 02/19/2020, 10/19/2020  . Tdap 01/15/2020  . Zoster Recombinat (Shingrix) 10/23/2020, 01/14/2021    Advanced directives: not yet completed  Conditions/risks identified: none new  Follow up in one year for your annual wellness visit   Preventive Care 40-64 Years, Male Preventive care refers to lifestyle choices and visits with your health care provider that can promote health and wellness. What does preventive care include?  A yearly physical exam. This is also called an annual well check.  Dental exams once or twice a year.  Routine eye exams. Ask your health care provider how often you should have your eyes checked.  Personal lifestyle choices, including:  Daily care of your teeth and gums.  Regular physical activity.  Eating a healthy diet.  Avoiding tobacco and drug use.  Limiting alcohol  use.  Practicing safe sex.  Taking low-dose aspirin every day starting at age 58. What happens during an annual well check? The services and screenings done by your health care provider during your annual well check will depend on your age, overall health, lifestyle risk factors, and family history of disease. Counseling  Your health care provider may ask you questions about your:  Alcohol use.  Tobacco use.  Drug use.  Emotional well-being.  Home and relationship well-being.  Sexual activity.  Eating habits.  Work and work Astronomerenvironment. Screening  You may have the following tests or measurements:  Height, weight, and BMI.  Blood pressure.  Lipid and cholesterol levels. These may be checked every 5 years, or more frequently if you are over 58 years old.  Skin check.  Lung cancer screening. You may have this screening every year starting at age 755 if you have a 30-pack-year history of smoking and currently smoke or have quit within the past 15 years.  Fecal occult blood test (FOBT) of the stool. You may have this test every year starting at age 58.  Flexible sigmoidoscopy or colonoscopy. You may have a sigmoidoscopy every 5 years or a colonoscopy every 10 years starting at age 58.  Prostate cancer screening. Recommendations will vary depending on your family history and other risks.  Hepatitis C blood test.  Hepatitis B blood test.  Sexually transmitted disease (STD) testing.  Diabetes screening. This is done by checking your blood sugar (glucose) after you have not eaten  for a while (fasting). You may have this done every 1-3 years. Discuss your test results, treatment options, and if necessary, the need for more tests with your health care provider. Vaccines  Your health care provider may recommend certain vaccines, such as:  Influenza vaccine. This is recommended every year.  Tetanus, diphtheria, and acellular pertussis (Tdap, Td) vaccine. You may need a Td  booster every 10 years.  Zoster vaccine. You may need this after age 44.  Pneumococcal 13-valent conjugate (PCV13) vaccine. You may need this if you have certain conditions and have not been vaccinated.  Pneumococcal polysaccharide (PPSV23) vaccine. You may need one or two doses if you smoke cigarettes or if you have certain conditions. Talk to your health care provider about which screenings and vaccines you need and how often you need them. This information is not intended to replace advice given to you by your health care provider. Make sure you discuss any questions you have with your health care provider. Document Released: 11/14/2015 Document Revised: 07/07/2016 Document Reviewed: 08/19/2015 Elsevier Interactive Patient Education  2017 [employer].  Fall Prevention in the Home Falls can cause injuries. They can happen to people of all ages. There are many things you can do to make your home safe and to help prevent falls. What can I do on the outside of my home?  Regularly fix the edges of walkways and driveways and fix any cracks.  Remove anything that might make you trip as you walk through a door, such as a raised step or threshold.  Trim any bushes or trees on the path to your home.  Use bright outdoor lighting.  Clear any walking paths of anything that might make someone trip, such as rocks or tools.  Regularly check to see if handrails are loose or broken. Make sure that both sides of any steps have handrails.  Any raised decks and porches should have guardrails on the edges.  Have any leaves, snow, or ice cleared regularly.  Use sand or salt on walking paths during winter.  Clean up any spills in your garage right away. This includes oil or grease spills. What can I do in the bathroom?  Use night lights.  Install grab bars by the toilet and in the tub and shower. Do not use towel bars as grab bars.  Use non-skid mats or decals in the tub or shower.  If you  need to sit down in the shower, use a plastic, non-slip stool.  Keep the floor dry. Clean up any water that spills on the floor as soon as it happens.  Remove soap buildup in the tub or shower regularly.  Attach bath mats securely with double-sided non-slip rug tape.  Do not have throw rugs and other things on the floor that can make you trip. What can I do in the bedroom?  Use night lights.  Make sure that you have a light by your bed that is easy to reach.  Do not use any sheets or blankets that are too big for your bed. They should not hang down onto the floor.  Have a firm chair that has side arms. You can use this for support while you get dressed.  Do not have throw rugs and other things on the floor that can make you trip. What can I do in the kitchen?  Clean up any spills right away.  Avoid walking on wet floors.  Keep items that you use a lot in easy-to-reach places.  If you need to reach something above you, use a strong step stool that has a grab bar.  Keep electrical cords out of the way.  Do not use floor polish or wax that makes floors slippery. If you must use wax, use non-skid floor wax.  Do not have throw rugs and other things on the floor that can make you trip. What can I do with my stairs?  Do not leave any items on the stairs.  Make sure that there are handrails on both sides of the stairs and use them. Fix handrails that are broken or loose. Make sure that handrails are as long as the stairways.  Check any carpeting to make sure that it is firmly attached to the stairs. Fix any carpet that is loose or worn.  Avoid having throw rugs at the top or bottom of the stairs. If you do have throw rugs, attach them to the floor with carpet tape.  Make sure that you have a light switch at the top of the stairs and the bottom of the stairs. If you do not have them, ask someone to add them for you. What else can I do to help prevent falls?  Wear shoes  that:  Do not have high heels.  Have rubber bottoms.  Are comfortable and fit you well.  Are closed at the toe. Do not wear sandals.  If you use a stepladder:  Make sure that it is fully opened. Do not climb a closed stepladder.  Make sure that both sides of the stepladder are locked into place.  Ask someone to hold it for you, if possible.  Clearly mark and make sure that you can see:  Any grab bars or handrails.  First and last steps.  Where the edge of each step is.  Use tools that help you move around (mobility aids) if they are needed. These include:  Canes.  Walkers.  Scooters.  Crutches.  Turn on the lights when you go into a dark area. Replace any light bulbs as soon as they burn out.  Set up your furniture so you have a clear path. Avoid moving your furniture around.  If any of your floors are uneven, fix them.  If there are any pets around you, be aware of where they are.  Review your medicines with your doctor. Some medicines can make you feel dizzy. This can increase your chance of falling. Ask your doctor what other things that you can do to help prevent falls. This information is not intended to replace advice given to you by your health care provider. Make sure you discuss any questions you have with your health care provider. Document Released: 08/14/2009 Document Revised: 03/25/2016 Document Reviewed: 11/22/2014 Elsevier Interactive Patient Education  2017 Elsevier Inc.  Opioid Pain Medicine Management Opioid pain medicines are strong medicines that are used to treat bad or very bad pain. When you take them for a short time, they can help you:  Sleep better.  Do better in physical therapy.  Feel better during the first few days after you get hurt.  Recover from surgery. Only take these medicines if a doctor says that you can. You should only take them for a short time. This is because opioids can be hard to stop taking (they are  addictive). The longer you take opioids, the harder it may be to stop taking them (opioid use disorder). What are the risks? Opioids can cause problems (side effects). Taking them for more  than 3 days raises your chance of problems, such as:  Trouble pooping (constipation).  Feeling sick to your stomach (nausea).  Vomiting.  Feeling very sleepy.  Confusion.  Not being able to stop taking the medicine.  Breathing problems. Taking opioids for a long time can make it hard for you to do daily tasks. It can also put you at risk for:  Car accidents.  Depression.  Suicide.  Heart attack.  Taking too much of the medicine (overdose), which can sometimes lead to death. What is a pain treatment plan? A pain treatment plan is a plan made by you and your doctor. Work with your doctor to make a plan for treating your pain. To help you do this:  Talk about the goals of your treatment, including: ? How much pain you might expect to have. ? How you will manage the pain.  Talk about the risks and benefits of taking these medicines for your condition.  Remember that a good treatment plan uses more than one approach and lowers the risks of side effects.  Tell your doctor about the amount of medicines you take and about any drug or alcohol use.  Get your pain medicine prescriptions from only one doctor. Pain can be managed with other treatments. Work with your doctor to find other ways to help your pain, such as:  Physical therapy.  Counseling.  Eating healthy foods.  Brain exercises.  Massage.  Meditation.  Other pain medicines.  Doing gentle exercises. Tapering your use of opioids If you have been taking opioids for more than a few weeks, you may need to slowly decrease (taper) how much you take until you stop taking them. Doing this can lower your chance of having symptoms, such as:  Pain and cramping in your belly (abdomen).  Feeling sick to your  stomach.  Sweating.  Feeling very sleepy.  Feeling restless.  Shaking you cannot control (tremors).  Cravings for the medicine. Do not try to stop taking them by yourself. Work with your doctor to stop. Your doctor will help you take less until you are not taking the medicine at all. Follow these instructions at home: Safety and storage  While you are taking opioids: ? Do not drive. ? Do not use machines or power tools. ? Do not sign important papers (legal documents). ? Do not drink alcohol. ? Do not take sleeping pills. ? Do not take care of children by yourself. ? Do not do activities where you need to climb or be in high places, like working on a ladder. ? Do not go into any water, such as a lake, river, ocean, swimming pool, or hot tub.  Keep your opioids locked up or in a place where children cannot reach them.  Do not share your pain medicine with anyone.   Getting rid of leftover pills Do not save any leftover pills. Get rid of leftover pills safely by:  Taking them to a take-back program in your area.  Bringing them to a pharmacy that has a container for throwing away pills (pill disposal).  Flushing them down the toilet. Check the label or package insert of your medicine to see whether this is safe to do.  Throwing them in the trash. Check the label or package insert of your medicine to see whether this is safe to do. If it is safe to throw them out: 1. Take the pills out of their container. 2. Put the pills into a container you can seal.  3. Mix the pills with used coffee grounds, food scraps, dirt, or cat litter. 4. Put this in the trash. Activity  Return to your normal activities as told by your doctor. Ask your doctor what activities are safe for you.  Avoid doing things that make your pain worse.  Do exercises as told by your doctor. General instructions  You may need to take these actions to prevent or treat trouble pooping: ? Drink enough fluid to  keep your pee (urine) pale yellow. ? Take over-the-counter or prescription medicines. ? Eat foods that are high in fiber. These include beans, whole grains, and fresh fruits and vegetables. ? Limit foods that are high in fat and sugar. These include fried or sweet foods.  Keep all follow-up visits as told by your doctor. This is important. Where to find support If you have been taking opioids for a long time, think about getting help quitting from a local support group or counselor. Ask your doctor about this. Where to find more information  Centers for Disease Control and Prevention (CDC): FootballExhibition.com.br  [place] Food and Drug Administration (FDA): PumpkinSearch.com.ee Get help right away if: Seek medical care right away if you are taking opioids and you, or people close to you, notice any of the following:  You have trouble breathing.  Your breathing is slower or more shallow than normal.  You have a very slow heartbeat.  You feel very confused.  You pass out (faint).  You are very sleepy.  Your speech is not normal.  You feel sick to your stomach and vomit.  You have cold skin.  You have blue lips or fingernails.  Your muscles are weak (limp) and your body seems floppy.  The black centers of your eyes (pupils) are smaller than normal. If you think that you or someone else may have taken too much of an opioid medicine, get medical help right away. Call your local emergency services (911 in the [place] Do not drive yourself to the hospital. If you ever feel like you may hurt yourself or others, or have thoughts about taking your own life, get help right away. You can go to your nearest emergency department or call:  Your local emergency services (911 in the [place]  The hotline of the [facility] 306-304-2860 in the [place]  A suicide crisis helpline, such as the National Suicide Prevention Lifeline at 8638522003. This is open 24 hours a day. Summary  Opioid  are strong medicines that are used to treat bad or very bad pain.  A pain treatment plan is a plan made by you and your doctor. Work with your doctor to make a plan for treating your pain.  Work with your doctor to find other ways to help your pain.  If you think that you or someone else may have taken too much of an opioid, get help right away. This information is not intended to replace advice given to you by your health care provider. Make sure you discuss any questions you have with your health care provider. Document Revised: 08/24/2019 Document Reviewed: 11/17/2018 Elsevier Patient Education  2021 [employer].

## 2021-03-20 NOTE — Progress Notes (Signed)
Subjective:   CONNELL BOGNAR is a 60 y.o. male who presents for an Initial Medicare Annual Wellness Visit.  Review of Systems    No ROS.  Medicare Wellness Virtual Visit.  Visual/audio telehealth visit, UTA vital signs.   See social history for additional risk factors.   Cardiac Risk Factors include: male gender;hypertension     Objective:    Today's Vitals   03/20/21 1034  Weight: 298 lb (135.2 kg)  Height: 5\' 10"  (1.778 m)   Body mass index is 42.76 kg/m.  Advanced Directives 03/20/2021 10/07/2017  Does Patient Have a Medical Advance Directive? No No  Does patient want to make changes to medical advance directive? No - Patient declined -  Would patient like information on creating a medical advance directive? - Yes (MAU/Ambulatory/Procedural Areas - Information given)    Current Medications (verified) Outpatient Encounter Medications as of 03/20/2021  Medication Sig  . oxyCODONE-acetaminophen (PERCOCET) 7.5-325 MG tablet Take 1 tablet by mouth 2 (two) times daily as needed for severe pain.  03-01-1975 albuterol (ACCUNEB) 0.63 MG/3ML nebulizer solution Take 3 mLs (0.63 mg total) by nebulization every 6 (six) hours as needed for wheezing.  Marland Kitchen amLODipine (NORVASC) 10 MG tablet Take 1 tablet (10 mg total) by mouth daily.  Marland Kitchen aspirin EC 81 MG tablet Take 1 tablet (81 mg total) by mouth daily.  Marland Kitchen atorvastatin (LIPITOR) 40 MG tablet Take 1 tablet (40 mg total) by mouth daily at 6 PM.  . CALCIUM PO Take by mouth.  . Cholecalciferol (VITAMIN D3) 50 MCG (2000 UT) TABS Take 6,000 tablets by mouth.  . Ginkgo Biloba 40 MG TABS Take 1 tablet by mouth daily.  Marland Kitchen losartan (COZAAR) 50 MG tablet Take 1 tablet (50 mg total) by mouth daily.  . methocarbamol (ROBAXIN) 500 MG tablet Take 1 tablet (500 mg total) by mouth every 8 (eight) hours as needed for muscle spasms.  . metoprolol succinate (TOPROL-XL) 25 MG 24 hr tablet Take 1 tablet (25 mg total) by mouth daily.  . Multiple Vitamin (MULTIVITAMIN  WITH MINERALS) TABS tablet Take 1 tablet by mouth daily.  . Omega-3 Fatty Acids (FISH OIL PO) Take by mouth.   No facility-administered encounter medications on file as of 03/20/2021.    Allergies (verified) No known allergies   History: Past Medical History:  Diagnosis Date  . Anginal pain (HCC)   . CAD (coronary artery disease)    s/p stent Dr. 03/22/2021; in 40s stents  . Difficult intubation   . Diverticulitis   . Dysrhythmia   . HLD (hyperlipidemia)   . Hypertension   . Myocardial infarction (HCC)     Feb.2008  . Obesity   . Pneumothorax 2006  . Sleep apnea    CPAP   Past Surgical History:  Procedure Laterality Date  . CARDIAC CATHETERIZATION     with stents  . PLEURAL SCARIFICATION Left 2006  . SHOULDER ARTHROSCOPY WITH OPEN ROTATOR CUFF REPAIR Left 10/12/2017   Procedure: SHOULDER ARTHROSCOPY WITH OPEN ROTATOR CUFF REPAIR;  Surgeon: 14/10/2017, MD;  Location: [facility] ORS;  Service: Orthopedics;  Laterality: Left;  Left shoulder arthroscopy, subaccromial decompression, distal clavical resection and biceps tenotomy  . shoulder joint replacement Left    surgery x 2 Dr. Deeann Saint ortho  . TONSILLECTOMY     Family History  Problem Relation Age of Onset  . Depression Mother   . Anemia Mother   . Thyroid disease Mother   . Huntington's disease Mother   . CAD  Father        s/p bypass   . Obesity Father   . Huntington's disease Sister   . Depression Sister   . Schizophrenia Brother   . Insomnia Brother   . Colon cancer Neg Hx   . Esophageal cancer Neg Hx   . Pancreatic cancer Neg Hx   . Stomach cancer Neg Hx   . Liver disease Neg Hx    Social History   Socioeconomic History  . Marital status: Married    Spouse name: Not on file  . Number of children: Not on file  . Years of education: Not on file  . Highest education level: Not on file  Occupational History  . Not on file  Tobacco Use  . Smoking status: Current Some Day Smoker    Types: Cigarettes    Last  attempt to quit: 10/07/2010    Years since quitting: 10.4  . Smokeless tobacco: Never Used  Vaping Use  . Vaping Use: Never used  Substance and Sexual Activity  . Alcohol use: Yes    Alcohol/week: 6.0 standard drinks    Types: 3 Glasses of wine, 3 Cans of beer per week    Comment: daily  . Drug use: No  . Sexual activity: Yes    Partners: Male  Other Topics Concern  . 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 Difficulty of Paying Living Expenses: Not hard at all  Food Insecurity: No Food Insecurity  . Worried About Programme researcher, broadcasting/film/videounning Out of Food in the Last Year: Never true  . Ran Out of Food in the Last Year: Never true  Transportation Needs: No Transportation Needs  . Lack of Transportation (Medical): No  . Lack of Transportation (Non-Medical): No  Physical Activity: Not on file  Stress: No Stress Concern Present  . Feeling of Stress : Not at all  Social Connections: Unknown  . Frequency of Communication with Friends and Family: Not on file  . Frequency of Social Gatherings with Friends and Family: Not on file  . Attends Religious Services: Not on file  . Active Member of Clubs or Organizations: Not on file  . Attends BankerClub or Organization Meetings: Not on file  . Marital Status: Married    Tobacco Counseling Ready to quit: Not Answered Counseling given: Not Answered   Clinical Intake:  Pre-visit preparation completed: Yes        Diabetes: No  How often do you need to have someone help you when you read instructions, pamphlets, or other written materials from your doctor or pharmacy?: 1 - Never    Interpreter Needed?: No      Activities of Daily Living In your present state of health, do you  have any difficulty performing the following activities: 03/20/2021 12/17/2020  Hearing? N N  Vision? N Y  Difficulty concentrating or making decisions? N N  Walking or climbing stairs? Y Y  Dressing or bathing? N Y  Doing errands, shopping? N Y  Quarry managerreparing Food and eating ? N -  Using the Toilet? N -  In the past six months, have you accidently leaked urine? N -  Do you  have problems with loss of bowel control? N -  Managing your Medications? N -  Managing your Finances? N -  Housekeeping or managing your Housekeeping? N -  Some recent data might be hidden    Patient Care Team: McLean-Scocuzza, Pasty Spillers, MD as PCP - General (Internal Medicine)  Indicate any recent Medical Services you may have received from other than [facility] providers in the past year (date may be approximate).     Assessment:   This is a routine wellness examination for Chrissie Noa.  I connected with Drake today by telephone and verified that I am speaking with the correct person using two identifiers. Location patient: home Location provider: work Persons participating in the virtual visit: patient, Engineer, civil (consulting).    I discussed the limitations, risks, security and privacy concerns of performing an evaluation and management service by telephone and the availability of in person appointments. The patient expressed understanding and verbally consented to this telephonic visit.    Interactive audio and video telecommunications were attempted between this provider and patient, however failed, due to patient having technical difficulties OR patient did not have access to video capability.  We continued and completed visit with audio only.  Some vital signs may be absent or patient reported.   Hearing/Vision screen  Hearing Screening   125Hz  250Hz  500Hz  1000Hz  2000Hz  3000Hz  4000Hz  6000Hz  8000Hz   Right ear:           Left ear:           Comments: Patient is able to hear conversational tones without difficulty.  No issues  reported.  Vision Screening Comments: Followed by [facility] Wears corrective lenses Visual acuity not assessed, virtual visit.  They have seen their ophthalmologist in the last 12 months.     Dietary issues and exercise activities discussed: Current Exercise Habits: Home exercise routine, Type of exercise: stretching, Intensity: Mild  Regular diet Good water intake  Goals Addressed              This Visit's Progress     Patient Stated   .  Weight goal 245lb (pt-stated)        Increase physical activity with bike riding Healthy diet Decrease/stop alcohol intake       Depression Screen PHQ 2/9 Scores 03/20/2021 12/17/2020  PHQ - 2 Score 0 1  PHQ- 9 Score - 1    Fall Risk Fall Risk  03/20/2021 01/23/2021 12/17/2020 11/27/2020 10/22/2020  Falls in the past year? 0 0 0 0 0  Number falls in past yr: 0 0 0 - 0  Injury with Fall? 0 0 0 - 0  Risk for fall due to : - Impaired balance/gait - - -  Follow up Falls evaluation completed Falls evaluation completed Falls evaluation completed - Falls evaluation completed    FALL RISK PREVENTION PERTAINING TO THE HOME: Handrails in use when climbing stairs?  Home free of loose throw rugs in walkways, pet beds, electrical cords, etc? Yes  Adequate lighting in your home to reduce risk of falls? Yes   ASSISTIVE DEVICES UTILIZED TO PREVENT FALLS: Life alert? No  Use of a cane, walker or w/c? Yes , crutch Grab bars in the bathroom? Yes   Shower chair or bench in shower? Yes   Elevated toilet seat or a handicapped toilet? No   TIMED UP AND GO: Was the test performed? No .   Cognitive Function:  Patient is alert and oriented x3.  Denies difficulty focusing, making decisions, memory  loss.  Enjoys online classes, reading and other brain health activities.  MMSE/6CIT deferred. Normal by direct communication/observation.      Immunizations Immunization History  Administered Date(s) Administered  . Influenza-Unspecified  07/01/2019, 08/05/2020  . PFIZER(Purple Top)SARS-COV-2 Vaccination 01/29/2020, 02/19/2020, 10/19/2020  . Tdap 01/15/2020  . Zoster Recombinat (Shingrix) 10/23/2020, 01/14/2021   Covid vaccine second booster not yet completed.  Health Maintenance Health Maintenance  Topic Date Due  . COVID-19 Vaccine (4 - Booster for Pfizer series) 01/17/2021  . COLONOSCOPY (Pts 45-59yrs Insurance coverage will need to be confirmed)  10/22/2021 (Originally 07/22/2008)  . INFLUENZA VACCINE  06/01/2021  . TETANUS/TDAP  01/14/2030  . Hepatitis C Screening  Completed  . HIV Screening  Completed  . HPV VACCINES  Aged Out   Colonoscopy- previously deferred until December.   Vision Screening: Recommended annual ophthalmology exams for early detection of glaucoma and other disorders of the eye. Is the patient up to date with their annual eye exam?  Yes   Dental Screening: Recommended annual dental exams for proper oral hygiene.  Community Resource Referral / Chronic Care Management: CRR required this visit?  No   CCM required this visit?  No      Plan:   Keep all routine maintenance appointments.   I have personally reviewed and noted the following in the patient's chart:   . Medical and social history . Use of alcohol, tobacco or illicit drugs  . Current medications and supplements including opioid prescriptions. Patient is currently taking opioid prescriptions. Information provided to patient regarding non-opioid alternatives. Patient advised to discuss non-opioid treatment plan with their provider. Managed by pcp. Care plan in chart.  . Functional ability and status . Nutritional status . Physical activity . Advanced directives . List of other physicians . Hospitalizations, surgeries, and ER visits in previous 12 months . Vitals . Screenings to include cognitive, depression, and falls . Referrals and appointments  In addition, I have reviewed and discussed with patient certain preventive  protocols, quality metrics, and best practice recommendations. A written personalized care plan for preventive services as well as general preventive health recommendations were provided to patient via mychart.     Ashok Pall, LPN   12/23/9796

## 2021-03-27 ENCOUNTER — Other Ambulatory Visit: Payer: Self-pay | Admitting: Internal Medicine

## 2021-03-27 DIAGNOSIS — M5441 Lumbago with sciatica, right side: Secondary | ICD-10-CM

## 2021-03-27 DIAGNOSIS — M87051 Idiopathic aseptic necrosis of right femur: Secondary | ICD-10-CM

## 2021-03-27 DIAGNOSIS — M25551 Pain in right hip: Secondary | ICD-10-CM

## 2021-03-27 DIAGNOSIS — G8929 Other chronic pain: Secondary | ICD-10-CM

## 2021-03-27 DIAGNOSIS — M79651 Pain in right thigh: Secondary | ICD-10-CM

## 2021-03-27 DIAGNOSIS — M47816 Spondylosis without myelopathy or radiculopathy, lumbar region: Secondary | ICD-10-CM

## 2021-03-27 MED ORDER — OXYCODONE-ACETAMINOPHEN 7.5-325 MG PO TABS: 1.0000 | ORAL_TABLET | Freq: Two times a day (BID) | ORAL | 0 refills | Status: DC | PRN

## 2021-03-27 NOTE — Telephone Encounter (Signed)
RX Refill:percocet Last Seen:01-23-21 Last ordered:02-24-21

## 2021-03-28 ENCOUNTER — Other Ambulatory Visit: Payer: Self-pay | Admitting: Internal Medicine

## 2021-03-28 DIAGNOSIS — M5441 Lumbago with sciatica, right side: Secondary | ICD-10-CM

## 2021-03-28 DIAGNOSIS — M25551 Pain in right hip: Secondary | ICD-10-CM

## 2021-03-28 DIAGNOSIS — M79651 Pain in right thigh: Secondary | ICD-10-CM

## 2021-03-28 DIAGNOSIS — G8929 Other chronic pain: Secondary | ICD-10-CM

## 2021-03-28 DIAGNOSIS — M87051 Idiopathic aseptic necrosis of right femur: Secondary | ICD-10-CM

## 2021-03-28 DIAGNOSIS — M47816 Spondylosis without myelopathy or radiculopathy, lumbar region: Secondary | ICD-10-CM

## 2021-03-31 ENCOUNTER — Telehealth: Payer: Self-pay | Admitting: Internal Medicine

## 2021-03-31 NOTE — Telephone Encounter (Signed)
Pain pills he gets 10 pills per month in 30 days  Sent 03/27/21 he needs to call pharmacy  If requesting more than 10 will require pain management clinic

## 2021-03-31 NOTE — Telephone Encounter (Signed)
Last Fill 03/27/21 for 10 tablets no refill, last OV 12/2020

## 2021-04-09 DIAGNOSIS — Z0100 Encounter for examination of eyes and vision without abnormal findings: Secondary | ICD-10-CM | POA: Diagnosis not present

## 2021-04-26 ENCOUNTER — Other Ambulatory Visit: Payer: Self-pay | Admitting: Internal Medicine

## 2021-04-26 DIAGNOSIS — G8929 Other chronic pain: Secondary | ICD-10-CM

## 2021-04-26 DIAGNOSIS — M47816 Spondylosis without myelopathy or radiculopathy, lumbar region: Secondary | ICD-10-CM

## 2021-04-26 DIAGNOSIS — M79651 Pain in right thigh: Secondary | ICD-10-CM

## 2021-04-26 DIAGNOSIS — M87051 Idiopathic aseptic necrosis of right femur: Secondary | ICD-10-CM

## 2021-04-26 DIAGNOSIS — M25551 Pain in right hip: Secondary | ICD-10-CM

## 2021-04-27 NOTE — Telephone Encounter (Signed)
Please advise on what monthly amount you would like Patient to have and then I can create a pain contract

## 2021-04-30 ENCOUNTER — Other Ambulatory Visit: Payer: Self-pay | Admitting: Internal Medicine

## 2021-04-30 DIAGNOSIS — M87051 Idiopathic aseptic necrosis of right femur: Secondary | ICD-10-CM

## 2021-04-30 DIAGNOSIS — M25551 Pain in right hip: Secondary | ICD-10-CM

## 2021-04-30 DIAGNOSIS — M79651 Pain in right thigh: Secondary | ICD-10-CM

## 2021-04-30 DIAGNOSIS — G8929 Other chronic pain: Secondary | ICD-10-CM

## 2021-04-30 DIAGNOSIS — M47816 Spondylosis without myelopathy or radiculopathy, lumbar region: Secondary | ICD-10-CM

## 2021-04-30 MED ORDER — OXYCODONE-ACETAMINOPHEN 7.5-325 MG PO TABS: 1.0000 | ORAL_TABLET | Freq: Two times a day (BID) | ORAL | 0 refills | Status: DC | PRN

## 2021-04-30 NOTE — Telephone Encounter (Signed)
He will need to sign new pain contract  Will increase to max # 20 per month and will not exceed this amount going forward per month as is in computer  Sent to [facility] #20/month

## 2021-05-06 ENCOUNTER — Telehealth: Payer: Self-pay | Admitting: Internal Medicine

## 2021-05-06 NOTE — Telephone Encounter (Signed)
Pt needs to resign pain contract increase quantity pain pills #10 to #20   Call to set this up and reprint contract please

## 2021-05-06 NOTE — Telephone Encounter (Signed)
Left message to return call.  Needing Patient to sign pain contract placed upfront.

## 2021-05-08 NOTE — Telephone Encounter (Signed)
LVM for patient to call back.   Scott Villanueva,cma  

## 2021-05-11 NOTE — Telephone Encounter (Signed)
See 03/31/21 Patient message encounter. Patient will come in to sign pain contract

## 2021-09-01 ENCOUNTER — Encounter: Payer: Self-pay | Admitting: Internal Medicine

## 2021-09-01 ENCOUNTER — Ambulatory Visit (INDEPENDENT_AMBULATORY_CARE_PROVIDER_SITE_OTHER): Payer: Medicare HMO | Admitting: Internal Medicine

## 2021-09-01 ENCOUNTER — Other Ambulatory Visit: Payer: Self-pay

## 2021-09-01 VITALS — BP 138/86 | HR 77 | Temp 97.5°F | Ht 70.0 in | Wt 308.4 lb

## 2021-09-01 DIAGNOSIS — I2510 Atherosclerotic heart disease of native coronary artery without angina pectoris: Secondary | ICD-10-CM

## 2021-09-01 DIAGNOSIS — I10 Essential (primary) hypertension: Secondary | ICD-10-CM | POA: Diagnosis not present

## 2021-09-01 DIAGNOSIS — M109 Gout, unspecified: Secondary | ICD-10-CM

## 2021-09-01 DIAGNOSIS — R7303 Prediabetes: Secondary | ICD-10-CM | POA: Diagnosis not present

## 2021-09-01 DIAGNOSIS — M79651 Pain in right thigh: Secondary | ICD-10-CM

## 2021-09-01 DIAGNOSIS — M5441 Lumbago with sciatica, right side: Secondary | ICD-10-CM | POA: Diagnosis not present

## 2021-09-01 DIAGNOSIS — M87051 Idiopathic aseptic necrosis of right femur: Secondary | ICD-10-CM | POA: Diagnosis not present

## 2021-09-01 DIAGNOSIS — M25551 Pain in right hip: Secondary | ICD-10-CM | POA: Diagnosis not present

## 2021-09-01 DIAGNOSIS — G8929 Other chronic pain: Secondary | ICD-10-CM

## 2021-09-01 DIAGNOSIS — E785 Hyperlipidemia, unspecified: Secondary | ICD-10-CM

## 2021-09-01 DIAGNOSIS — M47816 Spondylosis without myelopathy or radiculopathy, lumbar region: Secondary | ICD-10-CM

## 2021-09-01 LAB — LIPID PANEL
Cholesterol: 261 mg/dL — ABNORMAL HIGH (ref 0–200)
HDL: 35.2 mg/dL — ABNORMAL LOW (ref >39.00)
Total CHOL/HDL Ratio: 7
Triglycerides: 497 mg/dL — ABNORMAL HIGH (ref 0.0–149.0)

## 2021-09-01 LAB — CBC WITH DIFFERENTIAL/PLATELET
Basophils Absolute: 0 10*3/uL (ref 0.0–0.1)
Basophils Relative: 0.3 % (ref 0.0–3.0)
Eosinophils Absolute: 0.2 10*3/uL (ref 0.0–0.7)
Eosinophils Relative: 3.3 % (ref 0.0–5.0)
HCT: 46.8 % (ref 39.0–52.0)
Hemoglobin: 15.8 g/dL (ref 13.0–17.0)
Lymphocytes Relative: 31.4 % (ref 12.0–46.0)
Lymphs Abs: 1.5 10*3/uL (ref 0.7–4.0)
MCHC: 33.7 g/dL (ref 30.0–36.0)
MCV: 95.8 fl (ref 78.0–100.0)
Monocytes Absolute: 0.4 10*3/uL (ref 0.1–1.0)
Monocytes Relative: 8.2 % (ref 3.0–12.0)
Neutro Abs: 2.8 10*3/uL (ref 1.4–7.7)
Neutrophils Relative %: 56.8 % (ref 43.0–77.0)
Platelets: 174 10*3/uL (ref 150.0–400.0)
RBC: 4.89 Mil/uL (ref 4.22–5.81)
RDW: 13.3 % (ref 11.5–15.5)
WBC: 4.9 10*3/uL (ref 4.0–10.5)

## 2021-09-01 LAB — COMPREHENSIVE METABOLIC PANEL
ALT: 21 U/L (ref 0–53)
AST: 14 U/L (ref 0–37)
Albumin: 4.2 g/dL (ref 3.5–5.2)
Alkaline Phosphatase: 85 U/L (ref 39–117)
BUN: 16 mg/dL (ref 6–23)
CO2: 27 mEq/L (ref 19–32)
Calcium: 9.1 mg/dL (ref 8.4–10.5)
Chloride: 105 mEq/L (ref 96–112)
Creatinine, Ser: 1.02 mg/dL (ref 0.40–1.50)
GFR: 81.24 mL/min (ref >60.00)
Glucose, Bld: 123 mg/dL — ABNORMAL HIGH (ref 70–99)
Potassium: 4.3 mEq/L (ref 3.5–5.1)
Sodium: 140 mEq/L (ref 135–145)
Total Bilirubin: 0.5 mg/dL (ref 0.2–1.2)
Total Protein: 6.7 g/dL (ref 6.0–8.3)

## 2021-09-01 LAB — LDL CHOLESTEROL, DIRECT: Direct LDL: 169 mg/dL

## 2021-09-01 LAB — HEMOGLOBIN A1C: Hgb A1c MFr Bld: 6 % (ref 4.6–6.5)

## 2021-09-01 LAB — URIC ACID: Uric Acid, Serum: 7.7 mg/dL (ref 4.0–7.8)

## 2021-09-01 MED ORDER — METOPROLOL SUCCINATE ER 25 MG PO TB24
25.0000 mg | ORAL_TABLET | Freq: Every day | ORAL | 3 refills | Status: DC
Start: 2021-09-01 — End: 2022-04-06

## 2021-09-01 MED ORDER — ALBUTEROL SULFATE 0.63 MG/3ML IN NEBU: 1.0000 | INHALATION_SOLUTION | Freq: Four times a day (QID) | RESPIRATORY_TRACT | 11 refills | Status: AC | PRN

## 2021-09-01 MED ORDER — ASPIRIN EC 81 MG PO TBEC: 81.0000 mg | DELAYED_RELEASE_TABLET | Freq: Every day | ORAL | 3 refills | Status: AC

## 2021-09-01 MED ORDER — AMLODIPINE BESYLATE 10 MG PO TABS: 10.0000 mg | ORAL_TABLET | Freq: Every day | ORAL | 3 refills | Status: DC

## 2021-09-01 MED ORDER — OXYCODONE-ACETAMINOPHEN 7.5-325 MG PO TABS: 1.0000 | ORAL_TABLET | Freq: Two times a day (BID) | ORAL | 0 refills | Status: DC | PRN

## 2021-09-01 MED ORDER — LOSARTAN POTASSIUM 50 MG PO TABS: 50.0000 mg | ORAL_TABLET | Freq: Every day | ORAL | 3 refills | Status: DC

## 2021-09-01 MED ORDER — ATORVASTATIN CALCIUM 40 MG PO TABS: 40.0000 mg | ORAL_TABLET | Freq: Every day | ORAL | 3 refills | Status: DC

## 2021-09-01 NOTE — Progress Notes (Signed)
Chief Complaint  Patient presents with   Follow-up   F/u  1. Gout toe 2 months ago when eating seafood at beach and drinking alcohol tried nsaids otc which helped  2. Htn sl elevated not taking losartan 50 mg rec, taking norvasc 10 mg and toprol 25 mg xl h/o cad needs to f/u cards Dr. Algie Coffer and if he retired let me know will refer leb cards in [place] 3. Chronic pain hips and low back tries stretches, heat, massage, ice and turning foot inside to walk instead of outside and helps declines pt for now  Refill of chronic pain med percocet 7.5-325 bid #20 prn today   Review of Systems  Constitutional:  Negative for weight loss.  HENT:  Negative for hearing loss.   Eyes:  Negative for blurred vision.  Respiratory:  Negative for shortness of breath.   Cardiovascular:  Negative for chest pain.  Gastrointestinal:  Negative for abdominal pain and blood in stool.  Musculoskeletal:  Positive for back pain and joint pain.  Skin:  Negative for rash.  Neurological:  Negative for headaches.  Psychiatric/Behavioral:  Negative for depression.   Past Medical History:  Diagnosis Date   Anginal pain (HCC)    CAD (coronary artery disease)    s/p stent Dr. Algie Coffer; in 40s stents   Difficult intubation    Diverticulitis    Dysrhythmia    HLD (hyperlipidemia)    Hypertension    Myocardial infarction [facility])     Feb.2008   Obesity    Pneumothorax 2006   Sleep apnea    CPAP   Past Surgical History:  Procedure Laterality Date   CARDIAC CATHETERIZATION     with stents   PLEURAL SCARIFICATION Left 2006   SHOULDER ARTHROSCOPY WITH OPEN ROTATOR CUFF REPAIR Left 10/12/2017   Procedure: SHOULDER ARTHROSCOPY WITH OPEN ROTATOR CUFF REPAIR;  Surgeon: Deeann Saint, MD;  Location: [facility] ORS;  Service: Orthopedics;  Laterality: Left;  Left shoulder arthroscopy, subaccromial decompression, distal clavical resection and biceps tenotomy   shoulder joint replacement Left    surgery x 2 Dr. Andrey Campanile ortho    TONSILLECTOMY     Family History  Problem Relation Age of Onset   Depression Mother    Anemia Mother    Thyroid disease Mother    Huntington's disease Mother    CAD Father        s/p bypass    Obesity Father    Huntington's disease Sister    Depression Sister    Schizophrenia Brother    Insomnia Brother    Colon cancer Neg Hx    Esophageal cancer Neg Hx    Pancreatic cancer Neg Hx    Stomach cancer Neg Hx    Liver disease Neg Hx    Social History   Socioeconomic History   Marital status: Married    Spouse name: Not on file   Number of children: Not on file   Years of education: Not on file   Highest education level: Not on file  Occupational History   Not on file  Tobacco Use   Smoking status: Some Days    Types: Cigarettes    Last attempt to quit: 10/07/2010    Years since quitting: 10.9   Smokeless tobacco: Never  Vaping Use   Vaping Use: Never used  Substance and Sexual Activity   Alcohol use: Yes    Alcohol/week: 6.0 standard drinks    Types: 3 Glasses of wine, 3 Cans of beer per week  Comment: daily   Drug use: No   Sexual activity: Yes    Partners: Male  Other Topics Concern   Not on file  Social History Narrative   Currently on disability    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    Married with 3 kids; as of 12/12/19 wife not working due to DM since 07/2019    Smoker    Social Determinants of Health   Financial Resource Strain: Low Risk    Difficulty of Paying Living Expenses: Not hard at all  Food Insecurity: No Food Insecurity   Worried About Programme researcher, broadcasting/film/video in the Last Year: Never true   Barista in the Last Year: Never true  Transportation Needs: No Transportation Needs   Lack of Transportation (Medical): No   Lack of Transportation (Non-Medical): No  Physical Activity: Not on file  Stress: No Stress Concern Present   Feeling of Stress : Not at all  Social Connections: Unknown    Frequency of Communication with Friends and Family: Not on file   Frequency of Social Gatherings with Friends and Family: Not on file   Attends Religious Services: Not on file   Active Member of Clubs or Organizations: Not on file   Attends Banker Meetings: Not on file   Marital Status: Married  Catering manager Violence: Not At Risk   Fear of Current or Ex-Partner: No   Emotionally Abused: No   Physically Abused: No   Sexually Abused: No   Current Meds  Medication Sig   CALCIUM PO Take by mouth.   Cholecalciferol (VITAMIN D3) 50 MCG (2000 UT) TABS Take 6,000 tablets by mouth.   Ginkgo Biloba 40 MG TABS Take 1 tablet by mouth daily.   methocarbamol (ROBAXIN) 500 MG tablet Take 1 tablet (500 mg total) by mouth every 8 (eight) hours as needed for muscle spasms.   Multiple Vitamin (MULTIVITAMIN WITH MINERALS) TABS tablet Take 1 tablet by mouth daily.   Omega-3 Fatty Acids (FISH OIL PO) Take by mouth.   [DISCONTINUED] albuterol (ACCUNEB) 0.63 MG/3ML nebulizer solution Take 3 mLs (0.63 mg total) by nebulization every 6 (six) hours as needed for wheezing.   [DISCONTINUED] amLODipine (NORVASC) 10 MG tablet Take 1 tablet (10 mg total) by mouth daily.   [DISCONTINUED] aspirin EC 81 MG tablet Take 1 tablet (81 mg total) by mouth daily.   [DISCONTINUED] atorvastatin (LIPITOR) 40 MG tablet Take 1 tablet (40 mg total) by mouth daily at 6 PM.   [DISCONTINUED] metoprolol succinate (TOPROL-XL) 25 MG 24 hr tablet Take 1 tablet (25 mg total) by mouth daily.   Allergies  Allergen Reactions   No Known Allergies    No results found for this or any previous visit (from the past 2160 hour(s)). Objective  Body mass index is 44.25 kg/m. Wt Readings from Last 3 Encounters:  09/01/21 (!) 308 lb 6.4 oz (139.9 kg)  03/20/21 298 lb (135.2 kg)  01/23/21 298 lb 6.4 oz (135.4 kg)   Temp Readings from Last 3 Encounters:  09/01/21 (!) 97.5 F (36.4 C) (Oral)  01/23/21 98 F (36.7 C) (Oral)   11/27/20 (!) 97.3 F (36.3 C)   BP Readings from Last 3 Encounters:  09/01/21 138/86  01/23/21 124/80  12/17/20 128/78   Pulse Readings from Last 3 Encounters:  09/01/21 77  01/23/21 71  11/27/20 83    Physical Exam Vitals and nursing note reviewed.  Constitutional:  Appearance: Normal appearance. He is well-developed and well-groomed. He is morbidly obese.  HENT:     Head: Normocephalic and atraumatic.  Eyes:     Conjunctiva/sclera: Conjunctivae normal.     Pupils: Pupils are equal, round, and reactive to light.  Cardiovascular:     Rate and Rhythm: Normal rate and regular rhythm.     Heart sounds: Normal heart sounds. No murmur heard. Abdominal:     General: Abdomen is flat. Bowel sounds are normal.  Skin:    General: Skin is warm and dry.  Neurological:     General: No focal deficit present.     Mental Status: He is alert and oriented to person, place, and time. Mental status is at baseline.     Gait: Gait normal.  Psychiatric:        Attention and Perception: Attention and perception normal.        Mood and Affect: Mood and affect normal.        Speech: Speech normal.        Behavior: Behavior normal. Behavior is cooperative.        Thought Content: Thought content normal.        Cognition and Memory: Cognition and memory normal.        Judgment: Judgment normal.    Assessment  Plan  Acute gout, unspecified cause, unspecified site - Plan: Uric acid Declines px med   Essential hypertension - Plan: metoprolol succinate (TOPROL-XL) 25 MG 24 hr tablet, Comprehensive metabolic panel, Lipid panel, CBC with Differential/Platelet, losartan (COZAAR) 50 MG tablet, amLODipine (NORVASC) 10 MG tablet Rec compliance monitor bp  Coronary artery disease involving native coronary artery of native heart without angina pectoris - Plan: metoprolol succinate (TOPROL-XL) 25 MG 24 hr tablet, aspirin EC 81 MG tablet, atorvastatin (LIPITOR) 40 MG tablet Needs f/u with cards    Prediabetes - Plan: Hemoglobin A1c Hyperlipidemia, unspecified hyperlipidemia type - Plan: atorvastatin (LIPITOR) 40 MG tablet Rec healthy diet and exercise   Chronic right-sided low back pain with right-sided sciatica - Plan: oxyCODONE-acetaminophen (PERCOCET) 7.5-325 MG tablet Right hip pain - Plan: oxyCODONE-acetaminophen (PERCOCET) 7.5-325 MG tablet Right thigh pain - Plan: oxyCODONE-acetaminophen (PERCOCET) 7.5-325 MG tablet Avascular necrosis of bone of hip, right (HCC) - Plan: oxyCODONE-acetaminophen (PERCOCET) 7.5-325 MG tablet -pt does not want to do surgery  Arthritis of facet joint of lumbar spine - Plan: oxyCODONE-acetaminophen (PERCOCET) 7.5-325 MG tablet   HM Flu shot had utd  Tdap utd  01/15/20 Consider shingrix vaccine, prevnar  covid 19 vx 4/4 Shingrix vaccine 2/2  normal PSA 01/14/21 0.93     Colonoscopy referred leb Gi Dr. Anselm Jungling referred 12/2019 pt never scheduled given # to call for future  11/03/20 negative cologuard    Skin referred derm previously Dr. Roseanne Reno in 2021/2022 skin tags removed doing well as of 12/17/20, 09/01/21   Smoker since age 30 y.o light per pt always < 1 ppd on and off 1 pk lasting 1-3 months x 45 years on and off    Consider testing for HD in future mother died of HD, 1/2 has gene as well   Cardiology Dr. Algie Coffer as of 12/17/20 has not seen    rec healthy diet and exercise    AVN b/l hips R>L [facility] Dr. Lyda Kalata appt 01/01/21 does not want surgery consider nonsurgical tx's  Mri right hip  IMPRESSION:   1. Avascular necrosis in the right femoral head with mild reactive edema in the head/neck, and a moderate  joint effusion.  2. Small focus of chronic avascular necrosis in the left femoral head, without associated edema or effusion.     01/01/21 lidocaine/ropivcaine shot right hip    rec healthy diet and exercise    Provider: Dr. French Ana McLean-Scocuzza-Internal Medicine

## 2021-09-01 NOTE — Patient Instructions (Addendum)
Call cardiology Dr. Algie Coffer If retired let me know will refer  cardiology in [place]   Goal blood pressure <130/<80  Consider-Pneumococcal Conjugate Vaccine (Prevnar 13) Suspension for Injection What is this medication? PNEUMOCOCCAL VACCINE (NEU mo KOK al vak SEEN) is a vaccine used to prevent pneumococcus bacterial infections. These bacteria can cause serious infections like pneumonia, meningitis, and blood infections. This vaccine will lower your chance of getting pneumonia. If you do get pneumonia, it can make your symptoms milder and your illness shorter. This vaccine will not treat an infection and will not cause infection. This vaccine is recommended for infants and young children, adults with certain medical conditions, and adults 65 years or older. This medicine may be used for other purposes; ask your health care provider or pharmacist if you have questions. COMMON BRAND NAME(S): Prevnar, Prevnar 13 What should I tell my care team before I take this medication? They need to know if you have any of these conditions: bleeding problems fever immune system problems an unusual or allergic reaction to pneumococcal vaccine, diphtheria toxoid, other vaccines, latex, other medicines, foods, dyes, or preservatives pregnant or trying to get pregnant breast-feeding How should I use this medication? This vaccine is for injection into a muscle. It is given by a health care professional. A copy of Vaccine Information Statements will be given before each vaccination. Read this sheet carefully each time. The sheet may change frequently. Talk to your pediatrician regarding the use of this medicine in children. While this drug may be prescribed for children as young as 35 weeks old for selected conditions, precautions do apply. Overdosage: If you think you have taken too much of this medicine contact a poison control center or emergency room at once. NOTE: This medicine is only for you. Do not  share this medicine with others. What if I miss a dose? It is important not to miss your dose. Call your doctor or health care professional if you are unable to keep an appointment. What may interact with this medication? medicines for cancer chemotherapy medicines that suppress your immune function steroid medicines like prednisone or cortisone This list may not describe all possible interactions. Give your health care provider a list of all the medicines, herbs, non-prescription drugs, or dietary supplements you use. Also tell them if you smoke, drink alcohol, or use illegal drugs. Some items may interact with your medicine. What should I watch for while using this medication? Mild fever and pain should go away in 3 days or less. Report any unusual symptoms to your doctor or health care professional. What side effects may I notice from receiving this medication? Side effects that you should report to your doctor or health care professional as soon as possible: allergic reactions like skin rash, itching or hives, swelling of the face, lips, or tongue breathing problems confused fast or irregular heartbeat fever over 102 degrees F seizures unusual bleeding or bruising unusual muscle weakness Side effects that usually do not require medical attention (report to your doctor or health care professional if they continue or are bothersome): aches and pains diarrhea fever of 102 degrees F or less headache irritable loss of appetite pain, tender at site where injected trouble sleeping This list may not describe all possible side effects. Call your doctor for medical advice about side effects. You may report side effects to FDA at 1-800-FDA-1088. Where should I keep my medication? This does not apply. This vaccine is given in a clinic, pharmacy, doctor's office, or other  health care setting and will not be stored at home. NOTE: This sheet is a summary. It may not cover all possible  information. If you have questions about this medicine, talk to your doctor, pharmacist, or health care provider.  2022 Elsevier/Gold Standard (2014-07-25 10:27:27)  Gout Gout is a condition that causes painful swelling of the joints. Gout is a type of inflammation of the joints (arthritis). This condition is caused by having too much uric acid in the body. Uric acid is a chemical that forms when the body breaks down substances called purines. Purines are important for building body proteins. When the body has too much uric acid, sharp crystals can form and build up inside the joints. This causes pain and swelling. Gout attacks can happen quickly and may be very painful (acute gout). Over time, the attacks can affect more joints and become more frequent (chronic gout). Gout can also cause uric acid to build up under the skin and inside the kidneys. What are the causes? This condition is caused by too much uric acid in your blood. This can happen because: Your kidneys do not remove enough uric acid from your blood. This is the most common cause. Your body makes too much uric acid. This can happen with some cancers and cancer treatments. It can also occur if your body is breaking down too many red blood cells (hemolytic anemia). You eat too many foods that are high in purines. These foods include organ meats and some seafood. Alcohol, especially beer, is also high in purines. A gout attack may be triggered by trauma or stress. What increases the risk? You are more likely to develop this condition if you: Have a family history of gout. Are male and middle-aged. Are male and have gone through menopause. Are obese. Frequently drink alcohol, especially beer. Are dehydrated. Lose weight too quickly. Have an organ transplant. Have lead poisoning. Take certain medicines, including aspirin, cyclosporine, diuretics, levodopa, and niacin. Have kidney disease. Have a skin condition called  psoriasis. What are the signs or symptoms? An attack of acute gout happens quickly. It usually occurs in just one joint. The most common place is the big toe. Attacks often start at night. Other joints that may be affected include joints of the feet, ankle, knee, fingers, wrist, or elbow. Symptoms of this condition may include: Severe pain. Warmth. Swelling. Stiffness. Tenderness. The affected joint may be very painful to touch. Shiny, red, or purple skin. Chills and fever. Chronic gout may cause symptoms more frequently. More joints may be involved. You may also have white or yellow lumps (tophi) on your hands or feet or in other areas near your joints. How is this diagnosed? This condition is diagnosed based on your symptoms, medical history, and physical exam. You may have tests, such as: Blood tests to measure uric acid levels. Removal of joint fluid with a thin needle (aspiration) to look for uric acid crystals. X-rays to look for joint damage. How is this treated? Treatment for this condition has two phases: treating an acute attack and preventing future attacks. Acute gout treatment may include medicines to reduce pain and swelling, including: NSAIDs. Steroids. These are strong anti-inflammatory medicines that can be taken by mouth (orally) or injected into a joint. Colchicine. This medicine relieves pain and swelling when it is taken soon after an attack. It can be given by mouth or through an IV. Preventive treatment may include: Daily use of smaller doses of NSAIDs or colchicine. Use of a  medicine that reduces uric acid levels in your blood. Changes to your diet. You may need to see a dietitian about what to eat and drink to prevent gout. Follow these instructions at home: During a gout attack  If directed, put ice on the affected area: Put ice in a plastic bag. Place a towel between your skin and the bag. Leave the ice on for 20 minutes, 2-3 times a day. Raise (elevate) the  affected joint above the level of your heart as often as possible. Rest the joint as much as possible. If the affected joint is in your leg, you may be given crutches to use. Follow instructions from your health care provider about eating or drinking restrictions. Avoiding future gout attacks Follow a low-purine diet as told by your dietitian or health care provider. Avoid foods and drinks that are high in purines, including liver, kidney, anchovies, asparagus, herring, mushrooms, mussels, and beer. Maintain a healthy weight or lose weight if you are overweight. If you want to lose weight, talk with your health care provider. It is important that you do not lose weight too quickly. Start or maintain an exercise program as told by your health care provider. Eating and drinking Drink enough fluids to keep your urine pale yellow. If you drink alcohol: Limit how much you use to: 0-1 drink a day for women. 0-2 drinks a day for men. Be aware of how much alcohol is in your drink. In the [place] one drink equals one 12 oz bottle of beer (355 mL) one 5 oz glass of wine (148 mL), or one 1 oz glass of hard liquor (44 mL). General instructions Take over-the-counter and prescription medicines only as told by your health care provider. Do not drive or use heavy machinery while taking prescription pain medicine. Return to your normal activities as told by your health care provider. Ask your health care provider what activities are safe for you. Keep all follow-up visits as told by your health care provider. This is important. Contact a health care provider if you have: Another gout attack. Continuing symptoms of a gout attack after 10 days of treatment. Side effects from your medicines. Chills or a fever. Burning pain when you urinate. Pain in your lower back or belly. Get help right away if you: Have severe or uncontrolled pain. Cannot urinate. Summary Gout is painful swelling of the joints caused by  inflammation. The most common site of pain is the big toe, but it can affect other joints in the body. Medicines and dietary changes can help to prevent and treat gout attacks. This information is not intended to replace advice given to you by your health care provider. Make sure you discuss any questions you have with your health care provider. Document Revised: 05/10/2018 Document Reviewed: 05/10/2018 Elsevier Patient Education  2022 Else[employer]  Low-Purine Eating Plan A low-purine eating plan involves making food choices to limit your intake of purine. Purine is a kind of uric acid. Too much uric acid in your blood can cause certain conditions, such as gout and kidney stones. Eating a low-purine diet can help control these conditions. What are tips for following this plan? Reading food labels Avoid foods with saturated or Trans fat. Check the ingredient list of grains-based foods, such as bread and cereal, to make sure that they contain whole grains. Check the ingredient list of sauces or soups to make sure they do not contain meat or fish. When choosing soft drinks, check the ingredient  list to make sure they do not contain high-fructose corn syrup. Shopping  Buy plenty of fresh fruits and vegetables. Avoid buying canned or fresh fish. Buy dairy products labeled as low-fat or nonfat. Avoid buying premade or processed foods. These foods are often high in fat, salt (sodium), and added sugar. Cooking Use olive oil instead of butter when cooking. Oils like olive oil, canola oil, and sunflower oil contain healthy fats. Meal planning Learn which foods do or do not affect you. If you find out that a food tends to cause your gout symptoms to flare up, avoid eating that food. You can enjoy foods that do not cause problems. If you have any questions about a food item, talk with your dietitian or health care provider. Limit foods high in fat, especially saturated fat. Fat makes it harder for  your body to get rid of uric acid. Choose foods that are lower in fat and are lean sources of protein. General guidelines Limit alcohol intake to no more than 1 drink a day for nonpregnant women and 2 drinks a day for men. One drink equals 12 oz of beer, 5 oz of wine, or 1 oz of hard liquor. Alcohol can affect the way your body gets rid of uric acid. Drink plenty of water to keep your urine clear or pale yellow. Fluids can help remove uric acid from your body. If directed by your health care provider, take a vitamin C supplement. Work with your health care provider and dietitian to develop a plan to achieve or maintain a healthy weight. Losing weight can help reduce uric acid in your blood. What foods are recommended? The items listed may not be a complete list. Talk with your dietitian about what dietary choices are best for you. Foods low in purines Foods low in purines do not need to be limited. These include: All fruits. All low-purine vegetables, pickles, and olives. Breads, pasta, rice, cornbread, and popcorn. Cake and other baked goods. All dairy foods. Eggs, nuts, and nut butters. Spices and condiments, such as salt, herbs, and vinegar. Plant oils, butter, and margarine. Water, sugar-free soft drinks, tea, coffee, and cocoa. Vegetable-based soups, broths, sauces, and gravies. Foods moderate in purines Foods moderate in purines should be limited to the amounts listed.  cup of asparagus, cauliflower, spinach, mushrooms, or green peas, each day. 2/3 cup uncooked oatmeal, each day.  cup dry wheat bran or wheat germ, each day. 2-3 ounces of meat or poultry, each day. 4-6 ounces of shellfish, such as crab, lobster, oysters, or shrimp, each day. 1 cup cooked beans, peas, or lentils, each day. Soup, broths, or bouillon made from meat or fish. Limit these foods as much as possible. What foods are not recommended? The items listed may not be a complete list. Talk with your dietitian  about what dietary choices are best for you. Limit your intake of foods high in purines, including: Beer and other alcohol. Meat-based gravy or sauce. Canned or fresh fish, such as: Anchovies, sardines, herring, and tuna. Mussels and scallops. Codfish, trout, and haddock. Tomasa Blase. Organ meats, such as: Liver or kidney. Tripe. Sweetbreads (thymus gland or pancreas). Wild Education officer, environmental. Yeast or yeast extract supplements. Drinks sweetened with high-fructose corn syrup. Summary Eating a low-purine diet can help control conditions caused by too much uric acid in the body, such as gout or kidney stones. Choose low-purine foods, limit alcohol, and limit foods high in fat. You will learn over time which foods do or do  not affect you. If you find out that a food tends to cause your gout symptoms to flare up, avoid eating that food. This information is not intended to replace advice given to you by your health care provider. Make sure you discuss any questions you have with your health care provider. Document Revised: 01/31/2020 Document Reviewed: 01/31/2020 Elsevier Patient Education  2022 [employer].

## 2021-10-17 ENCOUNTER — Other Ambulatory Visit: Payer: Self-pay | Admitting: Internal Medicine

## 2021-10-17 DIAGNOSIS — G8929 Other chronic pain: Secondary | ICD-10-CM

## 2021-10-17 DIAGNOSIS — M47816 Spondylosis without myelopathy or radiculopathy, lumbar region: Secondary | ICD-10-CM

## 2021-10-17 DIAGNOSIS — M79651 Pain in right thigh: Secondary | ICD-10-CM

## 2021-10-17 DIAGNOSIS — M25551 Pain in right hip: Secondary | ICD-10-CM

## 2021-10-17 DIAGNOSIS — M5441 Lumbago with sciatica, right side: Secondary | ICD-10-CM

## 2021-10-17 DIAGNOSIS — M87051 Idiopathic aseptic necrosis of right femur: Secondary | ICD-10-CM

## 2021-10-19 ENCOUNTER — Other Ambulatory Visit: Payer: Self-pay | Admitting: Internal Medicine

## 2021-10-19 DIAGNOSIS — M5441 Lumbago with sciatica, right side: Secondary | ICD-10-CM

## 2021-10-19 DIAGNOSIS — M79651 Pain in right thigh: Secondary | ICD-10-CM

## 2021-10-19 DIAGNOSIS — M47816 Spondylosis without myelopathy or radiculopathy, lumbar region: Secondary | ICD-10-CM

## 2021-10-19 DIAGNOSIS — M25551 Pain in right hip: Secondary | ICD-10-CM

## 2021-10-19 DIAGNOSIS — M87051 Idiopathic aseptic necrosis of right femur: Secondary | ICD-10-CM

## 2021-10-19 MED ORDER — OXYCODONE-ACETAMINOPHEN 7.5-325 MG PO TABS: 1.0000 | ORAL_TABLET | Freq: Two times a day (BID) | ORAL | 0 refills | Status: DC | PRN

## 2021-10-19 NOTE — Telephone Encounter (Signed)
RX Refill: percocet Last Seen: 09-01-21 Last Ordered: 09-01-21 Next Appt: 01-13-22

## 2021-12-08 ENCOUNTER — Other Ambulatory Visit: Payer: Self-pay | Admitting: Internal Medicine

## 2021-12-08 DIAGNOSIS — M79651 Pain in right thigh: Secondary | ICD-10-CM

## 2021-12-08 DIAGNOSIS — M25551 Pain in right hip: Secondary | ICD-10-CM

## 2021-12-08 DIAGNOSIS — M87051 Idiopathic aseptic necrosis of right femur: Secondary | ICD-10-CM

## 2021-12-08 DIAGNOSIS — G8929 Other chronic pain: Secondary | ICD-10-CM

## 2021-12-08 DIAGNOSIS — M47816 Spondylosis without myelopathy or radiculopathy, lumbar region: Secondary | ICD-10-CM

## 2021-12-08 MED ORDER — OXYCODONE-ACETAMINOPHEN 7.5-325 MG PO TABS: 1.0000 | ORAL_TABLET | Freq: Two times a day (BID) | ORAL | 0 refills | Status: DC | PRN

## 2021-12-08 NOTE — Telephone Encounter (Signed)
Refilled: 10/19/2021 Last OV: 09/01/2021 Next OV: 01/13/2022

## 2022-01-13 ENCOUNTER — Ambulatory Visit: Payer: Medicare HMO | Admitting: Internal Medicine

## 2022-01-26 ENCOUNTER — Other Ambulatory Visit: Payer: Self-pay | Admitting: Internal Medicine

## 2022-01-26 DIAGNOSIS — G8929 Other chronic pain: Secondary | ICD-10-CM

## 2022-01-26 DIAGNOSIS — M87051 Idiopathic aseptic necrosis of right femur: Secondary | ICD-10-CM

## 2022-01-26 DIAGNOSIS — M25551 Pain in right hip: Secondary | ICD-10-CM

## 2022-01-26 DIAGNOSIS — M79651 Pain in right thigh: Secondary | ICD-10-CM

## 2022-01-26 DIAGNOSIS — M47816 Spondylosis without myelopathy or radiculopathy, lumbar region: Secondary | ICD-10-CM

## 2022-01-27 MED ORDER — OXYCODONE-ACETAMINOPHEN 7.5-325 MG PO TABS: 1.0000 | ORAL_TABLET | Freq: Two times a day (BID) | ORAL | 0 refills | Status: DC | PRN

## 2022-03-05 ENCOUNTER — Other Ambulatory Visit: Payer: Self-pay | Admitting: Internal Medicine

## 2022-03-05 DIAGNOSIS — M47816 Spondylosis without myelopathy or radiculopathy, lumbar region: Secondary | ICD-10-CM

## 2022-03-05 DIAGNOSIS — G8929 Other chronic pain: Secondary | ICD-10-CM

## 2022-03-05 DIAGNOSIS — M79651 Pain in right thigh: Secondary | ICD-10-CM

## 2022-03-05 DIAGNOSIS — M25551 Pain in right hip: Secondary | ICD-10-CM

## 2022-03-05 DIAGNOSIS — M87051 Idiopathic aseptic necrosis of right femur: Secondary | ICD-10-CM

## 2022-03-05 MED ORDER — OXYCODONE-ACETAMINOPHEN 7.5-325 MG PO TABS: 1.0000 | ORAL_TABLET | Freq: Two times a day (BID) | ORAL | 0 refills | Status: DC | PRN

## 2022-03-23 ENCOUNTER — Ambulatory Visit: Payer: Medicare HMO | Admitting: Internal Medicine

## 2022-03-23 ENCOUNTER — Telehealth: Payer: Self-pay | Admitting: Internal Medicine

## 2022-03-23 NOTE — Telephone Encounter (Signed)
Copied from CRM [id number]. Topic: Medicare AWV >> Mar 23, 2022 11:11 AM Harris-Coley, Avon Gully wrote: Reason for CRM: Left message for patient to schedule Annual Wellness Visit.  Please schedule with Nurse Health Advisor Denisa O'Brien-Blaney, LPN at [facility].  Please call [phone] ask for [facility]

## 2022-04-06 ENCOUNTER — Ambulatory Visit (INDEPENDENT_AMBULATORY_CARE_PROVIDER_SITE_OTHER): Payer: Medicare HMO | Admitting: Internal Medicine

## 2022-04-06 ENCOUNTER — Encounter: Payer: Self-pay | Admitting: Internal Medicine

## 2022-04-06 VITALS — BP 130/80 | HR 70 | Temp 98.3°F | Resp 14 | Ht 70.0 in | Wt 299.4 lb

## 2022-04-06 DIAGNOSIS — M79651 Pain in right thigh: Secondary | ICD-10-CM

## 2022-04-06 DIAGNOSIS — E785 Hyperlipidemia, unspecified: Secondary | ICD-10-CM

## 2022-04-06 DIAGNOSIS — M87052 Idiopathic aseptic necrosis of left femur: Secondary | ICD-10-CM

## 2022-04-06 DIAGNOSIS — Z125 Encounter for screening for malignant neoplasm of prostate: Secondary | ICD-10-CM

## 2022-04-06 DIAGNOSIS — R5383 Other fatigue: Secondary | ICD-10-CM

## 2022-04-06 DIAGNOSIS — M25551 Pain in right hip: Secondary | ICD-10-CM

## 2022-04-06 DIAGNOSIS — R7303 Prediabetes: Secondary | ICD-10-CM | POA: Diagnosis not present

## 2022-04-06 DIAGNOSIS — Z6841 Body Mass Index (BMI) 40.0 and over, adult: Secondary | ICD-10-CM

## 2022-04-06 DIAGNOSIS — I2510 Atherosclerotic heart disease of native coronary artery without angina pectoris: Secondary | ICD-10-CM

## 2022-04-06 DIAGNOSIS — Z1329 Encounter for screening for other suspected endocrine disorder: Secondary | ICD-10-CM | POA: Diagnosis not present

## 2022-04-06 DIAGNOSIS — I10 Essential (primary) hypertension: Secondary | ICD-10-CM

## 2022-04-06 DIAGNOSIS — M47816 Spondylosis without myelopathy or radiculopathy, lumbar region: Secondary | ICD-10-CM

## 2022-04-06 DIAGNOSIS — M87051 Idiopathic aseptic necrosis of right femur: Secondary | ICD-10-CM

## 2022-04-06 DIAGNOSIS — M5441 Lumbago with sciatica, right side: Secondary | ICD-10-CM

## 2022-04-06 DIAGNOSIS — Z1389 Encounter for screening for other disorder: Secondary | ICD-10-CM | POA: Diagnosis not present

## 2022-04-06 DIAGNOSIS — G8929 Other chronic pain: Secondary | ICD-10-CM

## 2022-04-06 LAB — COMPREHENSIVE METABOLIC PANEL
ALT: 24 U/L (ref 0–53)
AST: 17 U/L (ref 0–37)
Albumin: 4.4 g/dL (ref 3.5–5.2)
Alkaline Phosphatase: 61 U/L (ref 39–117)
BUN: 17 mg/dL (ref 6–23)
CO2: 24 mEq/L (ref 19–32)
Calcium: 9.3 mg/dL (ref 8.4–10.5)
Chloride: 102 mEq/L (ref 96–112)
Creatinine, Ser: 0.87 mg/dL (ref 0.40–1.50)
GFR: 94.98 mL/min (ref >60.00)
Glucose, Bld: 121 mg/dL — ABNORMAL HIGH (ref 70–99)
Potassium: 4.1 mEq/L (ref 3.5–5.1)
Sodium: 138 mEq/L (ref 135–145)
Total Bilirubin: 0.9 mg/dL (ref 0.2–1.2)
Total Protein: 7 g/dL (ref 6.0–8.3)

## 2022-04-06 LAB — CBC WITH DIFFERENTIAL/PLATELET
Basophils Absolute: 0 10*3/uL (ref 0.0–0.1)
Basophils Relative: 0.2 % (ref 0.0–3.0)
Eosinophils Absolute: 0.1 10*3/uL (ref 0.0–0.7)
Eosinophils Relative: 1.1 % (ref 0.0–5.0)
HCT: 44.7 % (ref 39.0–52.0)
Hemoglobin: 15.4 g/dL (ref 13.0–17.0)
Lymphocytes Relative: 29.2 % (ref 12.0–46.0)
Lymphs Abs: 1.5 10*3/uL (ref 0.7–4.0)
MCHC: 34.3 g/dL (ref 30.0–36.0)
MCV: 95.1 fl (ref 78.0–100.0)
Monocytes Absolute: 0.4 10*3/uL (ref 0.1–1.0)
Monocytes Relative: 7.4 % (ref 3.0–12.0)
Neutro Abs: 3.1 10*3/uL (ref 1.4–7.7)
Neutrophils Relative %: 62.1 % (ref 43.0–77.0)
Platelets: 167 10*3/uL (ref 150.0–400.0)
RBC: 4.7 Mil/uL (ref 4.22–5.81)
RDW: 13.3 % (ref 11.5–15.5)
WBC: 5 10*3/uL (ref 4.0–10.5)

## 2022-04-06 LAB — LIPID PANEL
Cholesterol: 216 mg/dL — ABNORMAL HIGH (ref 0–200)
HDL: 37.7 mg/dL — ABNORMAL LOW (ref >39.00)
NonHDL: 178.24
Total CHOL/HDL Ratio: 6
Triglycerides: 291 mg/dL — ABNORMAL HIGH (ref 0.0–149.0)
VLDL: 58.2 mg/dL — ABNORMAL HIGH (ref 0.0–40.0)

## 2022-04-06 LAB — LDL CHOLESTEROL, DIRECT: Direct LDL: 141 mg/dL

## 2022-04-06 LAB — PSA: PSA: 1.22 ng/mL (ref 0.10–4.00)

## 2022-04-06 LAB — HEMOGLOBIN A1C: Hgb A1c MFr Bld: 6.3 % (ref 4.6–6.5)

## 2022-04-06 LAB — TSH: TSH: 2.26 u[IU]/mL (ref 0.35–5.50)

## 2022-04-06 MED ORDER — METOPROLOL SUCCINATE ER 25 MG PO TB24: 25.0000 mg | ORAL_TABLET | Freq: Every day | ORAL | 3 refills | Status: DC

## 2022-04-06 MED ORDER — OXYCODONE-ACETAMINOPHEN 7.5-325 MG PO TABS: 1.0000 | ORAL_TABLET | Freq: Two times a day (BID) | ORAL | 0 refills | Status: DC | PRN

## 2022-04-06 MED ORDER — LOSARTAN POTASSIUM 50 MG PO TABS: 50.0000 mg | ORAL_TABLET | Freq: Every day | ORAL | 3 refills | Status: DC

## 2022-04-06 MED ORDER — ATORVASTATIN CALCIUM 40 MG PO TABS: 40.0000 mg | ORAL_TABLET | Freq: Every day | ORAL | 3 refills | Status: DC

## 2022-04-06 MED ORDER — METHOCARBAMOL 500 MG PO TABS: 500.0000 mg | ORAL_TABLET | Freq: Three times a day (TID) | ORAL | 3 refills | Status: AC | PRN

## 2022-04-06 MED ORDER — AMLODIPINE BESYLATE 10 MG PO TABS: 10.0000 mg | ORAL_TABLET | Freq: Every day | ORAL | 3 refills | Status: DC

## 2022-04-06 NOTE — Patient Instructions (Addendum)
Dr. Algie Coffer  Phone Fax E-mail Address  [phone] Not available [address]   [place] [address]     Specialties     Cardiology        Voltaren gel  Counter force brace   [facility]  Phone Fax E-mail Address  [phone] Not available [address]   [place] [address]     Specialties     Orthopedic Surgery        Tennis Elbow Rehab Ask your health care provider which exercises are safe for you. Do exercises exactly as told by your health care provider and adjust them as directed. It is normal to feel mild stretching, pulling, tightness, or discomfort as you do these exercises. Stop right away if you feel sudden pain or your pain gets worse. Do not begin these exercises until told by your health care provider. Stretching and range-of-motion exercises These exercises warm up your muscles and joints and improve the movement and flexibility of your elbow. Wrist flexion, assisted  Straighten your left / right elbow in front of you with your palm facing down toward the floor. If told by your health care provider, bend your left / right elbow to a 90-degree angle (right angle) at your side instead of holding it straight. With your other hand, gently push over the back of your left / right hand so your fingers point toward the floor (flexion). Stop when you feel a gentle stretch on the back of your forearm. Hold this position for __________ seconds. Repeat __________ times. Complete this exercise __________ times a day. Wrist extension, assisted  Straighten your left / right elbow in front of you with your palm facing up toward the ceiling. If told by your health care provider, bend your left / right elbow to a 90-degree angle (right angle) at your side instead of holding it straight. With your other hand, gently pull your left / right hand and fingers toward the floor (extension). Stop when you feel a gentle stretch on the palm  side of your forearm. Hold this position for __________ seconds. Repeat __________ times. Complete this exercise __________ times a day. Assisted forearm rotation, supination Sit or stand with your elbows at your side. Bend your left / right elbow to a 90-degree angle (right angle). Using your uninjured hand, turn your left / right palm up toward the ceiling (supination) until you feel a gentle stretch along the inside of your forearm. Hold this position for __________ seconds. Repeat __________ times. Complete this exercise __________ times a day. Assisted forearm rotation, pronation Sit or stand with your elbows at your side. Bend your left / right elbow to a 90-degree angle (right angle). Using your uninjured hand, turn your left / right palm down toward the floor (pronation) until you feel a gentle stretch along the outside of your forearm. Hold this position for __________ seconds. Repeat __________ times. Complete this exercise __________ times a day. Strengthening exercises These exercises build strength and endurance in your forearm and elbow. Endurance is the ability to use your muscles for a long time, even after they get tired. Radial deviation  Stand with a __________ weight or a hammer in your left / right hand. Or, sit while holding a rubber exercise band or tubing, with your left / right forearm supported on a table or countertop. Position your forearm so that the thumb is facing the ceiling, as if you are going to clap  your hands. This is the neutral position. Raise your hand upward in front of you so your thumb moves toward the ceiling (radial deviation), or pull up on the rubber tubing. Keep your forearm and elbow still while you move your wrist only. Hold this position for __________ seconds. Slowly return to the starting position. Repeat __________ times. Complete this exercise __________ times a day. Wrist extension, eccentric Sit with your left / right forearm  palm-down and supported on a table or other surface. Let your left / right wrist extend over the edge of the surface. Hold a __________ weight or a piece of exercise band or tubing in your left / right hand. If using a rubber exercise band or tubing, hold the other end of the tubing with your other hand. Use your uninjured hand to move your left / right hand up toward the ceiling. Take your uninjured hand away and slowly return to the starting position using only your left / right hand. Lowering your arm under tension is called eccentric extension. Repeat __________ times. Complete this exercise __________ times a day. Wrist extension Do not do this exercise if it causes pain at the outside of your elbow. Only do this exercise once instructed by your health care provider. Sit with your left / right forearm supported on a table or other surface and your palm turned down toward the floor. Let your left / right wrist extend over the edge of the surface. Hold a __________ weight or a piece of rubber exercise band or tubing. If you are using a rubber exercise band or tubing, hold the band or tubing in place with your other hand to provide resistance. Slowly bend your wrist so your hand moves up toward the ceiling (extension). Move only your wrist, keeping your forearm and elbow still. Hold this position for __________ seconds. Slowly return to the starting position. Repeat __________ times. Complete this exercise __________ times a day. Forearm rotation, supination To do this exercise, you will need a lightweight hammer or rubber mallet. Sit with your left / right forearm supported on a table or other surface. Bend your elbow to a 90-degree angle (right angle). Position your forearm so that your palm is facing down toward the floor, with your hand resting over the edge of the table. Hold a hammer in your left / right hand. To make this exercise easier, hold the hammer near the head of the hammer. To  make this exercise harder, hold the hammer near the end of the handle. Without moving your wrist or elbow, slowly rotate your forearm so your palm faces up toward the ceiling (supination). Hold this position for __________ seconds. Slowly return to the starting position. Repeat __________ times. Complete this exercise __________ times a day. Shoulder blade squeeze Sit in a stable chair or stand with good posture. If you are sitting down, do not let your back touch the back of the chair. Your arms should be at your sides with your elbows bent to a 90-degree angle (right angle). Position your forearms so that your thumbs are facing the ceiling (neutral position). Without lifting your shoulders up, squeeze your shoulder blades tightly together. Hold this position for __________ seconds. Slowly release and return to the starting position. Repeat __________ times. Complete this exercise __________ times a day. This information is not intended to replace advice given to you by your health care provider. Make sure you discuss any questions you have with your health care provider. Document Revised: 01/09/2020  Document Reviewed: 01/09/2020 Elsevier Patient Education  2023 Elsevier Inc.   Tennis Elbow  Tennis elbow (lateral epicondylitis) is inflammation of tendons in your outer forearm, near your elbow. Tendons are tissues that connect muscle to bone. When you have tennis elbow, inflammation affects the tendons that you use to bend your wrist and move your hand up. Inflammation occurs in the lower part of the upper arm bone (humerus), where the tendons connect to the bone (lateral epicondyle). Tennis elbow often affects people who play tennis, but anyone may get the condition from repeatedly extending the wrist or turning the forearm. What are the causes? This condition is usually caused by repeatedly extending the wrist, turning the forearm, and using the hands. It can result from sports or work that  requires repetitive forearm movements. In some cases, it may be caused by a sudden injury. What increases the risk? You are more likely to develop tennis elbow if you play tennis or another racket sport. You also have a higher risk if you frequently use your hands for work. Besides people who play tennis, others at greater risk include: People who use computers. Holiday representativeConstruction workers. People who work in [employer]. Musicians. Cooks. Cashiers. What are the signs or symptoms? Symptoms of this condition include: Pain and tenderness in the forearm and the outer part of the elbow. Pain may be felt only when using the arm, or it may be there all the time. A burning feeling that starts in the elbow and spreads down the forearm. A weak grip in the hand. How is this diagnosed? This condition is diagnosed based on your symptoms, your medical history, and a physical exam. You may also have X-rays or an MRI to: Confirm the diagnosis. Look for other issues. Check for tears in the ligaments, muscles, or tendons. How is this treated? Resting and icing your arm is often the first treatment. Your health care provider may also recommend: Medicines to reduce pain and inflammation. These may be in the form of a pill, topical gels, or shots of a steroid medicine (cortisone). An elbow strap to reduce stress on the area. Physical therapy. This may include massage or exercises or both. An elbow brace to restrict the movements that cause symptoms. If these treatments do not help relieve your symptoms, your health care provider may recommend surgery to remove damaged muscle and reattach healthy muscle to bone. Follow these instructions at home: If you have a brace or strap: Wear the brace or strap as told by your health care provider. Remove it only as told by your health care provider. Check the skin around the brace or strap every day. Tell your health care provider about any concerns. Loosen the brace if your  fingers tingle, become numb, or turn cold and blue. Keep the brace clean. If the brace or strap is not waterproof: Do not let it get wet. Cover it with a watertight covering when you take a bath or a shower. Managing pain, stiffness, and swelling  If directed, put ice on the injured area. To do this: If you have a removable brace or strap, remove it as told by your health care provider. Put ice in a plastic bag. Place a towel between your skin and the bag. Leave the ice on for 20 minutes, 2-3 times a day. Remove the ice if your skin turns bright red. This is very important. If you cannot feel pain, heat, or cold, you have a greater risk of damage to the area.  Move your fingers often to reduce stiffness and swelling. Activity Rest your elbow and wrist and avoid activities that cause symptoms as told by your health care provider. Do physical therapy exercises as told by your health care provider. If you lift an object, lift it with your palm facing up. This reduces stress on your elbow. Lifestyle If your tennis elbow is caused by sports, check your equipment and make sure that: You use it correctly. It is good match for you. If your tennis elbow is caused by work or computer use, take frequent breaks to stretch your arm. Talk with your employer about ways to manage your condition at work. General instructions Take over-the-counter and prescription medicines only as told by your health care provider. Do not use any products that contain nicotine or tobacco. These products include cigarettes, chewing tobacco, and vaping devices, such as e-cigarettes. If you need help quitting, ask your health care provider. Keep all follow-up visits. This is important. How is this prevented? Before and after activity: Warm up and stretch before being active. Cool down and stretch after being active. Give your body time to rest between periods of activity. During activity: Make sure to use equipment that  fits you. If you play tennis, put power in your stroke with your lower body. Avoid using your arm only. Maintain physical fitness, including: Strength. Flexibility. Endurance. Do exercises to strengthen the forearm muscles. Contact a health care provider if: You have pain that gets worse or does not get better with treatment. You have numbness or weakness in your forearm, hand, or fingers. Get help right away if: Your pain is severe. You cannot move your wrist. Summary Tennis elbow (lateral epicondylitis) is inflammation of tendons in your outer forearm, near your elbow. Common symptoms include pain and tenderness in your forearm and the outer part of your elbow. This condition is usually caused by repeatedly extending your wrist, turning your forearm, and using your hands. The first treatment is often resting and icing your arm to relieve symptoms. Further treatment may include taking medicine, getting physical therapy, wearing a brace or strap, or having surgery. This information is not intended to replace advice given to you by your health care provider. Make sure you discuss any questions you have with your health care provider. Document Revised: 04/29/2020 Document Reviewed: 04/29/2020 Elsevier Patient Education  2023 [employer].

## 2022-04-06 NOTE — Progress Notes (Signed)
Chief Complaint  Patient presents with   Follow-up    Disc about tennis elbow & how to get rid of it.    F/u  1. Htn controlled on losartan 50 mg qd and norvasc 10  2. Chronic pain b/l hips AVN due to chronic steroids declines surgery wants refill of pain meds percocet 7.5-325 mg bid  3. C/o right elbow pain tennis elbow x 2 months and wearing brace today and hands tingling disc call [facility] if needed for appt in future declines steroid shot for now right elbow   Review of Systems  Constitutional:  Negative for weight loss.  HENT:  Negative for hearing loss.   Eyes:  Negative for blurred vision.  Respiratory:  Negative for shortness of breath.   Cardiovascular:  Negative for chest pain.  Gastrointestinal:  Negative for abdominal pain and blood in stool.  Musculoskeletal:  Positive for joint pain. Negative for back pain.  Skin:  Negative for rash.  Neurological:  Negative for headaches.  Psychiatric/Behavioral:  Negative for depression.   Past Medical History:  Diagnosis Date   Anginal pain (HCC)    CAD (coronary artery disease)    s/p stent Dr. Algie CofferKadakia; in 40s stents   Difficult intubation    Diverticulitis    Dysrhythmia    HLD (hyperlipidemia)    Hypertension    Myocardial infarction [facility](HCC)     Feb.2008   Obesity    Pneumothorax 2006   Sleep apnea    CPAP   Past Surgical History:  Procedure Laterality Date   CARDIAC CATHETERIZATION     with stents   PLEURAL SCARIFICATION Left 2006   SHOULDER ARTHROSCOPY WITH OPEN ROTATOR CUFF REPAIR Left 10/12/2017   Procedure: SHOULDER ARTHROSCOPY WITH OPEN ROTATOR CUFF REPAIR;  Surgeon: Deeann SaintMiller, Howard, MD;  Location: [facility] ORS;  Service: Orthopedics;  Laterality: Left;  Left shoulder arthroscopy, subaccromial decompression, distal clavical resection and biceps tenotomy   shoulder joint replacement Left    surgery x 2 Dr. Andrey CampanileWilson ortho   TONSILLECTOMY     Family History  Problem Relation Age of Onset   Depression Mother     Anemia Mother    Thyroid disease Mother    Huntington's disease Mother    CAD Father        s/p bypass    Obesity Father    Huntington's disease Sister    Depression Sister    Schizophrenia Brother    Insomnia Brother    Colon cancer Neg Hx    Esophageal cancer Neg Hx    Pancreatic cancer Neg Hx    Stomach cancer Neg Hx    Liver disease Neg Hx    Social History   Socioeconomic History   Marital status: Married    Spouse name: Not on file   Number of children: Not on file   Years of education: Not on file   Highest education level: Not on file  Occupational History   Not on file  Tobacco Use   Smoking status: Some Days    Types: Cigarettes    Last attempt to quit: 10/07/2010    Years since quitting: 11.5   Smokeless tobacco: Never  Vaping Use   Vaping Use: Never used  Substance and Sexual Activity   Alcohol use: Yes    Alcohol/week: 6.0 standard drinks    Types: 3 Glasses of wine, 3 Cans of beer per week    Comment: daily   Drug use: No   Sexual activity: Yes  Partners: Male  Other Topics Concern   Not on file  Social History Narrative   Currently on disability    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   aspirin EC 81 MG tablet Take 1 tablet (81 mg total) by mouth daily.   CALCIUM PO Take by mouth.   Cholecalciferol (VITAMIN D3) 50 MCG (2000 UT) TABS Take 6,000 tablets by mouth.   Ginkgo  Biloba 40 MG TABS Take 1 tablet by mouth daily.   Multiple Vitamin (MULTIVITAMIN WITH MINERALS) TABS tablet Take 1 tablet by mouth daily.   Omega-3 Fatty Acids (FISH OIL PO) Take by mouth.   [DISCONTINUED] amLODipine (NORVASC) 10 MG tablet Take 1 tablet (10 mg total) by mouth daily.   [DISCONTINUED] atorvastatin (LIPITOR) 40 MG tablet Take 1 tablet (40 mg total) by mouth daily at 6 PM.   [DISCONTINUED] losartan (COZAAR) 50 MG tablet Take 1 tablet (50 mg total) by mouth daily.   [DISCONTINUED] methocarbamol (ROBAXIN) 500 MG tablet Take 1 tablet (500 mg total) by mouth every 8 (eight) hours as needed for muscle spasms.   [DISCONTINUED] metoprolol succinate (TOPROL-XL) 25 MG 24 hr tablet Take 1 tablet (25 mg total) by mouth daily.   [DISCONTINUED] oxyCODONE-acetaminophen (PERCOCET) 7.5-325 MG tablet Take 1 tablet by mouth 2 (two) times daily as needed for severe pain.   Allergies  Allergen Reactions   No Known Allergies    No results found for this or any previous visit (from the past 2160 hour(s)). Objective  Body mass index is 42.96 kg/m. Wt Readings from Last 3 Encounters:  04/06/22 299 lb 6.4 oz (135.8 kg)  09/01/21 (!) 308 lb 6.4 oz (139.9 kg)  03/20/21 298 lb (135.2 kg)   Temp Readings from Last 3 Encounters:  04/06/22 98.3 F (36.8 C) (Oral)  09/01/21 (!) 97.5 F (36.4 C) (Oral)  01/23/21 98 F (36.7 C) (Oral)   BP Readings from Last 3 Encounters:  04/06/22 130/80  09/01/21 138/86  01/23/21 124/80   Pulse Readings from Last 3 Encounters:  04/06/22 70  09/01/21 77  01/23/21 71    Physical Exam Vitals and nursing note reviewed.  Constitutional:      Appearance: Normal appearance. He is well-developed and well-groomed.  HENT:     Head: Normocephalic and atraumatic.  Eyes:     Conjunctiva/sclera: Conjunctivae normal.     Pupils: Pupils are equal, round, and reactive to light.  Cardiovascular:     Rate and Rhythm: Normal rate and regular rhythm.     Heart sounds:  Normal heart sounds.  Pulmonary:     Effort: Pulmonary effort is normal. No respiratory distress.     Breath sounds: Normal breath sounds.  Abdominal:     Tenderness: There is no abdominal tenderness.  Skin:    General: Skin is warm and moist.  Neurological:  General: No focal deficit present.     Mental Status: He is alert and oriented to person, place, and time. Mental status is at baseline.     Sensory: Sensation is intact.     Motor: Motor function is intact.     Coordination: Coordination is intact.     Gait: Gait is intact. Gait normal.  Psychiatric:        Attention and Perception: Attention and perception normal.        Mood and Affect: Mood and affect normal.        Speech: Speech normal.        Behavior: Behavior normal. Behavior is cooperative.        Thought Content: Thought content normal.        Cognition and Memory: Cognition and memory normal.        Judgment: Judgment normal.    Assessment  Plan  Primary hypertension on norvasc 10 mg qd on losartan 50 mg qd, toprol xl 25 mg qd - Plan: Comprehensive metabolic panel, Lipid panel, CBC w/Diff  Coronary artery disease involving native coronary artery of native heart without angina pectoris - Plan: metoprolol succinate (TOPROL-XL) 25 MG 24 hr tablet, atorvastatin (LIPITOR) 40 MG tablet  Hyperlipidemia, unspecified hyperlipidemia type - Plan: Lipid panel, atorvastatin (LIPITOR) 40 MG tablet  Prediabetes - Plan: Hemoglobin A1c  Avascular necrosis of bones of both hips (HCC) Due to steroids does not want surgery  Chronic right-sided low back pain with right-sided sciatica - Plan: oxyCODONE-acetaminophen (PERCOCET) 7.5-325 MG tablet, methocarbamol (ROBAXIN) 500 MG tablet Right thigh pain - Plan: oxyCODONE-acetaminophen (PERCOCET) 7.5-325 MG tablet  Avascular necrosis of bone of hip, right (HCC) - Plan: oxyCODONE-acetaminophen (PERCOCET) 7.5-325 MG tablet Right hip pain - Plan: oxyCODONE-acetaminophen (PERCOCET)  7.5-325 MG tablet   Arthritis of facet joint of lumbar spine - Plan: oxyCODONE-acetaminophen (PERCOCET) 7.5-325 MG tablet, methocarbamol (ROBAXIN) 500 MG tablet  Essential hypertension controlled - Plan: metoprolol succinate (TOPROL-XL) 25 MG 24 hr tablet, losartan (COZAAR) 50 MG tablet, amLODipine (NORVASC) 10 MG tablet  Fatigue, unspecified type - Plan: Comprehensive metabolic panel, CBC w/Diff, TSH, Urinalysis, Routine w reflex microscopic    HM Flu shot had utd  Tdap utd  01/15/20 Consider shingrix vaccine, prevnar  covid 19 vx 4/4 Shingrix vaccine 2/2   normal PSA 01/14/21 0.93 ordered today    Colonoscopy referred leb Gi Dr. Anselm Jungling referred 12/2019 pt never scheduled given # to call for future  04/2022 declines colonoscopy 11/03/20 negative cologuard    Skin referred derm previously Dr. Roseanne Reno in 2021/2022 skin tags removed doing well as of 12/17/20, 09/01/21   Smoker since age 70 y.o light per pt always < 1 ppd on and off 1 pk lasting 1-3 months x 45 years on and off    Consider testing for HD in future mother died of HD, 1/2 has gene as well    Cardiology Dr. Algie Coffer as of 12/17/20 has not seen    rec healthy diet and exercise    AVN b/l hips R>L [facility] Dr. Lyda Kalata appt 01/01/21 does not want surgery consider nonsurgical tx's  Mri right hip  IMPRESSION:   1. Avascular necrosis in the right femoral head with mild reactive edema in the head/neck, and a moderate joint effusion.  2. Small focus of chronic avascular necrosis in the left femoral head, without associated edema or effusion.     01/01/21 lidocaine/ropivcaine shot right hip    rec healthy diet and exercise  Provider: Dr. Olivia Mackie McLean-Scocuzza-Internal Medicine

## 2022-04-07 ENCOUNTER — Telehealth: Payer: Self-pay

## 2022-04-07 LAB — URINALYSIS, ROUTINE W REFLEX MICROSCOPIC
Bilirubin Urine: NEGATIVE
Glucose, UA: NEGATIVE
Hgb urine dipstick: NEGATIVE
Ketones, ur: NEGATIVE
Leukocytes,Ua: NEGATIVE
Nitrite: NEGATIVE
Protein, ur: NEGATIVE
Specific Gravity, Urine: 1.015 (ref 1.001–1.035)
pH: 6 (ref 5.0–8.0)

## 2022-04-07 NOTE — Telephone Encounter (Signed)
Lvm for pt to return call in regards to lab results  Per Dr.Tracy: Liver kidneys normal  Urine normal  Blood cts normal  +prediabetes worse A1c 6.3  Thyroid lab normal  PSA prostate cancer screening normal   Cholesterol uncontrolled is pt agreeable to increase lipitor to 80 ? Or add zetia to lipitor 40?

## 2022-04-09 ENCOUNTER — Other Ambulatory Visit: Payer: Self-pay | Admitting: Internal Medicine

## 2022-04-09 DIAGNOSIS — I2510 Atherosclerotic heart disease of native coronary artery without angina pectoris: Secondary | ICD-10-CM

## 2022-04-09 DIAGNOSIS — E785 Hyperlipidemia, unspecified: Secondary | ICD-10-CM

## 2022-04-09 MED ORDER — ATORVASTATIN CALCIUM 80 MG PO TABS: 80.0000 mg | ORAL_TABLET | Freq: Every day | ORAL | 3 refills | Status: DC

## 2022-04-11 ENCOUNTER — Other Ambulatory Visit: Payer: Self-pay | Admitting: Internal Medicine

## 2022-04-11 DIAGNOSIS — G8929 Other chronic pain: Secondary | ICD-10-CM

## 2022-04-11 DIAGNOSIS — M25551 Pain in right hip: Secondary | ICD-10-CM

## 2022-04-11 DIAGNOSIS — M87051 Idiopathic aseptic necrosis of right femur: Secondary | ICD-10-CM

## 2022-04-11 DIAGNOSIS — M79651 Pain in right thigh: Secondary | ICD-10-CM

## 2022-04-11 DIAGNOSIS — M47816 Spondylosis without myelopathy or radiculopathy, lumbar region: Secondary | ICD-10-CM

## 2022-04-13 MED ORDER — OXYCODONE-ACETAMINOPHEN 7.5-325 MG PO TABS: 1.0000 | ORAL_TABLET | Freq: Two times a day (BID) | ORAL | 0 refills | Status: DC | PRN

## 2022-04-15 ENCOUNTER — Other Ambulatory Visit: Payer: Self-pay | Admitting: Internal Medicine

## 2022-04-15 DIAGNOSIS — M25551 Pain in right hip: Secondary | ICD-10-CM

## 2022-04-15 DIAGNOSIS — M79651 Pain in right thigh: Secondary | ICD-10-CM

## 2022-04-15 DIAGNOSIS — G8929 Other chronic pain: Secondary | ICD-10-CM

## 2022-04-15 DIAGNOSIS — M87051 Idiopathic aseptic necrosis of right femur: Secondary | ICD-10-CM

## 2022-04-15 DIAGNOSIS — M47816 Spondylosis without myelopathy or radiculopathy, lumbar region: Secondary | ICD-10-CM

## 2022-04-16 NOTE — Telephone Encounter (Signed)
Pain meds sent 04/13/22

## 2022-04-18 ENCOUNTER — Telehealth: Payer: Self-pay | Admitting: Internal Medicine

## 2022-04-18 DIAGNOSIS — M47816 Spondylosis without myelopathy or radiculopathy, lumbar region: Secondary | ICD-10-CM

## 2022-04-18 DIAGNOSIS — M87051 Idiopathic aseptic necrosis of right femur: Secondary | ICD-10-CM

## 2022-04-18 DIAGNOSIS — G8929 Other chronic pain: Secondary | ICD-10-CM

## 2022-04-18 DIAGNOSIS — M79651 Pain in right thigh: Secondary | ICD-10-CM

## 2022-04-18 DIAGNOSIS — M25551 Pain in right hip: Secondary | ICD-10-CM

## 2022-04-19 ENCOUNTER — Other Ambulatory Visit: Payer: Self-pay

## 2022-04-20 DIAGNOSIS — R072 Precordial pain: Secondary | ICD-10-CM | POA: Diagnosis not present

## 2022-04-20 DIAGNOSIS — I2510 Atherosclerotic heart disease of native coronary artery without angina pectoris: Secondary | ICD-10-CM | POA: Diagnosis not present

## 2022-04-20 DIAGNOSIS — I10 Essential (primary) hypertension: Secondary | ICD-10-CM | POA: Diagnosis not present

## 2022-04-29 NOTE — Telephone Encounter (Signed)
Pt need a refill on oxycodone sent to [facility]. Pt states his pharmacy is out of them

## 2022-04-30 ENCOUNTER — Other Ambulatory Visit: Payer: Self-pay | Admitting: Internal Medicine

## 2022-04-30 DIAGNOSIS — M87051 Idiopathic aseptic necrosis of right femur: Secondary | ICD-10-CM

## 2022-04-30 DIAGNOSIS — M47816 Spondylosis without myelopathy or radiculopathy, lumbar region: Secondary | ICD-10-CM

## 2022-04-30 DIAGNOSIS — G8929 Other chronic pain: Secondary | ICD-10-CM

## 2022-04-30 DIAGNOSIS — M25551 Pain in right hip: Secondary | ICD-10-CM

## 2022-04-30 DIAGNOSIS — M79651 Pain in right thigh: Secondary | ICD-10-CM

## 2022-04-30 MED ORDER — OXYCODONE-ACETAMINOPHEN 7.5-325 MG PO TABS: 1.0000 | ORAL_TABLET | Freq: Two times a day (BID) | ORAL | 0 refills | Status: DC | PRN

## 2022-04-30 NOTE — Telephone Encounter (Signed)
If this is long term change he will need to sign new pain contract

## 2022-05-03 ENCOUNTER — Telehealth: Payer: Self-pay | Admitting: Internal Medicine

## 2022-05-03 NOTE — Telephone Encounter (Signed)
Copied from CRM [id number]. Topic: Medicare AWV >> May 03, 2022 10:50 AM Payton Doughty wrote: Reason for CRM: Left message for patient to schedule Annual Wellness Visit.  Please schedule with Nurse Health Advisor Denisa O'Brien-Blaney, LPN at [facility].  Please call [phone] ask for [facility]

## 2022-05-06 ENCOUNTER — Telehealth: Payer: Self-pay

## 2022-05-06 ENCOUNTER — Ambulatory Visit: Payer: Medicare HMO

## 2022-05-06 NOTE — Telephone Encounter (Signed)
Unable to reach patient for scheduled AWV. No answer. Left voicemail regarding rescheduling.

## 2022-06-03 ENCOUNTER — Other Ambulatory Visit: Payer: Self-pay | Admitting: Internal Medicine

## 2022-06-03 DIAGNOSIS — G8929 Other chronic pain: Secondary | ICD-10-CM

## 2022-06-03 DIAGNOSIS — M47816 Spondylosis without myelopathy or radiculopathy, lumbar region: Secondary | ICD-10-CM

## 2022-06-03 DIAGNOSIS — M79651 Pain in right thigh: Secondary | ICD-10-CM

## 2022-06-03 DIAGNOSIS — M25551 Pain in right hip: Secondary | ICD-10-CM

## 2022-06-03 DIAGNOSIS — M87051 Idiopathic aseptic necrosis of right femur: Secondary | ICD-10-CM

## 2022-06-04 MED ORDER — OXYCODONE-ACETAMINOPHEN 7.5-325 MG PO TABS: 1.0000 | ORAL_TABLET | Freq: Two times a day (BID) | ORAL | 0 refills | Status: DC | PRN

## 2022-06-28 ENCOUNTER — Telehealth: Payer: Self-pay | Admitting: Internal Medicine

## 2022-06-28 NOTE — Telephone Encounter (Signed)
Copied from CRM [id number]. Topic: Medicare AWV >> Jun 28, 2022  2:35 PM Payton Doughty wrote: Reason for CRM: Left message for patient to schedule Annual Wellness Visit.  Please schedule with Nurse Health Advisor Denisa O'Brien-Blaney, LPN at [facility]. This appt can be telephone or office visit.  Please call [phone] ask for [facility]

## 2022-07-12 ENCOUNTER — Ambulatory Visit: Payer: Medicare HMO

## 2022-07-12 ENCOUNTER — Telehealth: Payer: Self-pay

## 2022-07-12 NOTE — Telephone Encounter (Signed)
No answer when called for scheduled AWV. Left message to reschedule.  ?

## 2022-07-13 ENCOUNTER — Ambulatory Visit (INDEPENDENT_AMBULATORY_CARE_PROVIDER_SITE_OTHER): Payer: Medicare HMO

## 2022-07-13 VITALS — Ht 70.0 in | Wt 299.0 lb

## 2022-07-13 DIAGNOSIS — Z0000 Encounter for general adult medical examination without abnormal findings: Secondary | ICD-10-CM

## 2022-07-13 NOTE — Patient Instructions (Addendum)
Scott Villanueva , Thank you for taking time to come for your Medicare Wellness Visit. I appreciate your ongoing commitment to your health goals. Please review the following plan we discussed and let me know if I can assist you in the future.   These are the goals we discussed:  Goals       Patient Stated     Weight goal 245lb (pt-stated)      Increase physical activity with bike riding Healthy diet Decrease/stop alcohol intake Intermittent fasting         This is a list of the screening recommended for you and due dates:  Health Maintenance  Topic Date Due   COVID-19 Vaccine (6 - Pfizer risk series) 07/29/2022*   Flu Shot  01/30/2023*   Colon Cancer Screening  04/07/2023*   Tetanus Vaccine  01/14/2030   Hepatitis C Screening: USPSTF Recommendation to screen - Ages 18-79 yo.  Completed   HIV Screening  Completed   Zoster (Shingles) Vaccine  Completed   HPV Vaccine  Aged Out  *Topic was postponed. The date shown is not the original due date.   Opioid Pain Medicine Management Opioids are powerful medicines that are used to treat moderate to severe pain. When used for short periods of time, they can help you to: Sleep better. Do better in physical or occupational therapy. Feel better in the first few days after an injury. Recover from surgery. Opioids should be taken with the supervision of a trained health care provider. They should be taken for the shortest period of time possible. This is because opioids can be addictive, and the longer you take opioids, the greater your risk of addiction. This addiction can also be called opioid use disorder. What are the risks? Using opioid pain medicines for longer than 3 days increases your risk of side effects. Side effects include: Constipation. Nausea and vomiting. Breathing difficulties (respiratory depression). Drowsiness. Confusion. Opioid use disorder. Itching. Taking opioid pain medicine for a long period of time can affect your  ability to do daily tasks. It also puts you at risk for: Motor vehicle crashes. Depression. Suicide. Heart attack. Overdose, which can be life-threatening. What is a pain treatment plan? A pain treatment plan is an agreement between you and your health care provider. Pain is unique to each person, and treatments vary depending on your condition. To manage your pain, you and your health care provider need to work together. To help you do this: Discuss the goals of your treatment, including how much pain you might expect to have and how you will manage the pain. Review the risks and benefits of taking opioid medicines. Remember that a good treatment plan uses more than one approach and minimizes the chance of side effects. Be honest about the amount of medicines you take and about any drug or alcohol use. Get pain medicine prescriptions from only one health care provider. Pain can be managed with many types of alternative treatments. Ask your health care provider to refer you to one or more specialists who can help you manage pain through: Physical or occupational therapy. Counseling (cognitive behavioral therapy). Good nutrition. Biofeedback. Massage. Meditation. Non-opioid medicine. Following a gentle exercise program. How to use opioid pain medicine Taking medicine Take your pain medicine exactly as told by your health care provider. Take it only when you need it. If your pain gets less severe, you may take less than your prescribed dose if your health care provider approves. If you are not having  pain, do nottake pain medicine unless your health care provider tells you to take it. If your pain is severe, do nottry to treat it yourself by taking more pills than instructed on your prescription. Contact your health care provider for help. Write down the times when you take your pain medicine. It is easy to become confused while on pain medicine. Writing the time can help you avoid  overdose. Take other over-the-counter or prescription medicines only as told by your health care provider. Keeping yourself and others safe  While you are taking opioid pain medicine: Do not drive, use machinery, or power tools. Do not sign legal documents. Do not drink alcohol. Do not take sleeping pills. Do not supervise children by yourself. Do not do activities that require climbing or being in high places. Do not go to a lake, river, ocean, spa, or swimming pool. Do not share your pain medicine with anyone. Keep pain medicine in a locked cabinet or in a secure area where pets and children cannot reach it. Stopping your use of opioids If you have been taking opioid medicine for more than a few weeks, you may need to slowly decrease (taper) how much you take until you stop completely. Tapering your use of opioids can decrease your risk of symptoms of withdrawal, such as: Pain and cramping in the abdomen. Nausea. Sweating. Sleepiness. Restlessness. Uncontrollable shaking (tremors). Cravings for the medicine. Do not attempt to taper your use of opioids on your own. Talk with your health care provider about how to do this. Your health care provider may prescribe a step-down schedule based on how much medicine you are taking and how long you have been taking it. Getting rid of leftover pills Do not save any leftover pills. Get rid of leftover pills safely by: Taking the medicine to a prescription take-back program. This is usually offered by the county or law enforcement. Bringing them to a pharmacy that has a drug disposal container. Flushing them down the toilet. Check the label or package insert of your medicine to see whether this is safe to do. Throwing them out in the trash. Check the label or package insert of your medicine to see whether this is safe to do. If it is safe to throw it out, remove the medicine from the original container, put it into a sealable bag or container, and  mix it with used coffee grounds, food scraps, dirt, or cat litter before putting it in the trash. Follow these instructions at home: Activity Do exercises as told by your health care provider. Avoid activities that make your pain worse. Return to your normal activities as told by your health care provider. Ask your health care provider what activities are safe for you. General instructions You may need to take these actions to prevent or treat constipation: Drink enough fluid to keep your urine pale yellow. Take over-the-counter or prescription medicines. Eat foods that are high in fiber, such as beans, whole grains, and fresh fruits and vegetables. Limit foods that are high in fat and processed sugars, such as fried or sweet foods. Keep all follow-up visits. This is important. Where to find support If you have been taking opioids for a long time, you may benefit from receiving support for quitting from a local support group or counselor. Ask your health care provider for a referral to these resources in your area. Where to find more information Centers for Disease Control and Prevention (CDC): http://www.wolf.info/ [place] Food and Drug Administration (FDA):  [employer] Get help right away if: You may have taken too much of an opioid (overdosed). Common symptoms of an overdose: Your breathing is slower or more shallow than normal. You have a very slow heartbeat (pulse). You have slurred speech. You have nausea and vomiting. Your pupils become very small. You have other potential symptoms: You are very confused. You faint or feel like you will faint. You have cold, clammy skin. You have blue lips or fingernails. You have thoughts of harming yourself or harming others. These symptoms may represent a serious problem that is an emergency. Do not wait to see if the symptoms will go away. Get medical help right away. Call your local emergency services (911 in the [place] Do not drive yourself to the  hospital.  If you ever feel like you may hurt yourself or others, or have thoughts about taking your own life, get help right away. Go to your nearest emergency department or: Call your local emergency services (911 in the [place] Call the [facility] [phone] in the [place] Call a suicide crisis helpline, such as the [place] at 5181402304 or 988 in the [place]. This is open 24 hours a day in the [place] Text the Crisis Text Line at (251)688-7917 (in the [place].). Summary Opioid medicines can help you manage moderate to severe pain for a short period of time. A pain treatment plan is an agreement between you and your health care provider. Discuss the goals of your treatment, including how much pain you might expect to have and how you will manage the pain. If you think that you or someone else may have taken too much of an opioid, get medical help right away. This information is not intended to replace advice given to you by your health care provider. Make sure you discuss any questions you have with your health care provider. Document Revised: 05/13/2021 Document Reviewed: 01/28/2021 Elsevier Patient Education  [place].

## 2022-07-13 NOTE — Progress Notes (Signed)
Subjective:   Scott Villanueva is a 61 y.o. male who presents for Medicare Annual/Subsequent preventive examination.  Review of Systems    No ROS.  Medicare Wellness Virtual Visit.  Visual/audio telehealth visit, UTA vital signs.   See social history for additional risk factors.   Cardiac Risk Factors include: male gender     Objective:    Today's Vitals   07/13/22 1308  Weight: 299 lb (135.6 kg)  Height: 5\' 10"  (1.778 m)   Body mass index is 42.9 kg/m.     07/13/2022    1:17 PM 03/20/2021   10:50 AM 10/07/2017   12:10 PM  Advanced Directives  Does Patient Have a Medical Advance Directive? No No No  Does patient want to make changes to medical advance directive?  No - Patient declined   Would patient like information on creating a medical advance directive? No - Patient declined  Yes (MAU/Ambulatory/Procedural Areas - Information given)    Current Medications (verified) Outpatient Encounter Medications as of 07/13/2022  Medication Sig   albuterol (ACCUNEB) 0.63 MG/3ML nebulizer solution Take 3 mLs (0.63 mg total) by nebulization every 6 (six) hours as needed for wheezing.   amLODipine (NORVASC) 10 MG tablet Take 1 tablet (10 mg total) by mouth daily.   aspirin EC 81 MG tablet Take 1 tablet (81 mg total) by mouth daily.   atorvastatin (LIPITOR) 80 MG tablet Take 1 tablet (80 mg total) by mouth daily at 6 PM. D/c 40   CALCIUM PO Take by mouth.   Cholecalciferol (VITAMIN D3) 50 MCG (2000 UT) TABS Take 6,000 tablets by mouth.   Ginkgo Biloba 40 MG TABS Take 1 tablet by mouth daily.   losartan (COZAAR) 50 MG tablet Take 1 tablet (50 mg total) by mouth daily.   methocarbamol (ROBAXIN) 500 MG tablet Take 1 tablet (500 mg total) by mouth every 8 (eight) hours as needed for muscle spasms.   metoprolol succinate (TOPROL-XL) 25 MG 24 hr tablet Take 1 tablet (25 mg total) by mouth daily.   Multiple Vitamin (MULTIVITAMIN WITH MINERALS) TABS tablet Take 1 tablet by mouth daily.    Omega-3 Fatty Acids (FISH OIL PO) Take by mouth.   oxyCODONE-acetaminophen (PERCOCET) 7.5-325 MG tablet Take 1 tablet by mouth 2 (two) times daily as needed for severe pain.   No facility-administered encounter medications on file as of 07/13/2022.    Allergies (verified) No known allergies   History: Past Medical History:  Diagnosis Date   Anginal pain (HCC)    CAD (coronary artery disease)    s/p stent Dr. 09/12/2022; in 40s stents   Difficult intubation    Diverticulitis    Dysrhythmia    HLD (hyperlipidemia)    Hypertension    Myocardial infarction (HCC)     Feb.2008   Obesity    Pneumothorax 2006   Sleep apnea    CPAP   Past Surgical History:  Procedure Laterality Date   CARDIAC CATHETERIZATION     with stents   PLEURAL SCARIFICATION Left 2006   SHOULDER ARTHROSCOPY WITH OPEN ROTATOR CUFF REPAIR Left 10/12/2017   Procedure: SHOULDER ARTHROSCOPY WITH OPEN ROTATOR CUFF REPAIR;  Surgeon: 14/10/2017, MD;  Location: [facility] ORS;  Service: Orthopedics;  Laterality: Left;  Left shoulder arthroscopy, subaccromial decompression, distal clavical resection and biceps tenotomy   shoulder joint replacement Left    surgery x 2 Dr. Deeann Saint ortho   TONSILLECTOMY     Family History  Problem Relation Age of Onset  Depression Mother    Anemia Mother    Thyroid disease Mother    Huntington's disease Mother    CAD Father        s/p bypass    Obesity Father    Huntington's disease Sister    Depression Sister    Schizophrenia Brother    Insomnia Brother    Colon cancer Neg Hx    Esophageal cancer Neg Hx    Pancreatic cancer Neg Hx    Stomach cancer Neg Hx    Liver disease Neg Hx    Social History   Socioeconomic History   Marital status: Married    Spouse name: Not on file   Number of children: Not on file   Years of education: Not on file   Highest education level: Not on file  Occupational History   Not on file  Tobacco Use   Smoking status: Some Days    Types:  Cigarettes    Last attempt to quit: 10/07/2010    Years since quitting: 11.7   Smokeless tobacco: Never  Vaping Use   Vaping Use: Never used  Substance and Sexual Activity   Alcohol use: Yes    Alcohol/week: 6.0 standard drinks of alcohol    Types: 3 Glasses of wine, 3 Cans of beer per week    Comment: daily   Drug use: No   Sexual activity: Yes    Partners: Male  Other Topics Concern   Not on file  Social History Narrative   Currently on disability    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    Married with 3 kids; as of 12/12/19 wife not working due to DM since 07/2019    Smoker    Social Determinants of Health   Financial Resource Strain: Low Risk  (07/13/2022)   Overall Financial Resource Strain (CARDIA)    Difficulty of Paying Living Expenses: Not hard at all  Food Insecurity: No Food Insecurity (07/13/2022)   Hunger Vital Sign    Worried About Running Out of Food in the Last Year: Never true    Ran Out of Food in the Last Year: Never true  Transportation Needs: No Transportation Needs (07/13/2022)   PRAPARE - Administrator, Civil Service (Medical): No    Lack of Transportation (Non-Medical): No  Physical Activity: Not on file  Stress: No Stress Concern Present (07/13/2022)   [employer] of Occupational Health - Occupational Stress Questionnaire    Feeling of Stress : Not at all  Social Connections: Unknown (07/13/2022)   Social Connection and Isolation Panel [NHANES]    Frequency of Communication with Friends and Family: Not on file    Frequency of Social Gatherings with Friends and Family: Not on file    Attends Religious Services: Not on file    Active Member of Clubs or Organizations: Not on file    Attends Banker Meetings: Not on file    Marital Status: Married    Tobacco Counseling Ready to quit: Not Answered Counseling given: Not Answered   Clinical Intake:  Pre-visit preparation  completed: Yes        Diabetes: No  How often do you need to have someone help you when you read instructions, pamphlets, or other written materials from your doctor or pharmacy?: 1 - Never   Interpreter Needed?: No    Activities of Daily Living    07/13/2022    1:19 PM  In your present state of health, do you have any difficulty performing the following activities:  Hearing? 0  Vision? 0  Difficulty concentrating or making decisions? 0  Walking or climbing stairs? 1  Comment Ambulates with cane/crutch  Dressing or bathing? 0  Doing errands, shopping? 0  Preparing Food and eating ? N  Using the Toilet? N  In the past six months, have you accidently leaked urine? N  Do you have problems with loss of bowel control? N  Managing your Medications? Y  Managing your Finances? Y  Housekeeping or managing your Housekeeping? Y    Patient Care Team: McLean-Scocuzza, Pasty Spillers, MD as PCP - General (Internal Medicine)  Indicate any recent Medical Services you may have received from other than [facility] providers in the past year (date may be approximate).     Assessment:   This is a routine wellness examination for Chrissie Noa.  Virtual Visit via Telephone Note  I connected with  [name] on 07/13/22 at  1:00 PM EDT by telephone and verified that I am speaking with the correct person using two identifiers.  Location: Patient: home Provider: office Persons participating in the virtual visit: patient/Nurse Health Advisor   I discussed the limitations of performing an evaluation and management service by telehealth. We continued and completed visit with audio only. Some vital signs may be absent or patient reported.   Hearing/Vision screen Hearing Screening - Comments:: Patient is able to hear conversational tones without difficulty. No issues reported. Vision Screening - Comments:: Followed by [facility]  Wears corrective lenses They have seen their ophthalmologist in  the last 12 months  Dietary issues and exercise activities discussed: Current Exercise Habits: Home exercise routine, Intensity: Mild Regular diet Good water intake   Goals Addressed               This Visit's Progress     Patient Stated     Weight goal 245lb (pt-stated)   On track     Increase physical activity with bike riding Healthy diet Decrease/stop alcohol intake Intermittent fasting        Depression Screen    07/13/2022    1:12 PM 04/06/2022    8:17 AM 03/20/2021   10:45 AM 12/17/2020    1:23 PM  PHQ 2/9 Scores  PHQ - 2 Score 0 0 0 1  PHQ- 9 Score    1    Fall Risk    07/13/2022    1:18 PM 04/06/2022    8:16 AM 09/01/2021    9:38 AM 03/20/2021   10:51 AM 01/23/2021   10:27 AM  Fall Risk   Falls in the past year? 0 0 0 0 0  Number falls in past yr: 0 0 0 0 0  Injury with Fall?  0 0 0 0  Risk for fall due to : Impaired balance/gait No Fall Risks No Fall Risks  Impaired balance/gait  Risk for fall due to: Comment Ambulates with cane and/or crutch      Follow up Falls evaluation completed Falls evaluation completed Falls evaluation completed Falls evaluation completed Falls evaluation completed    FALL RISK PREVENTION PERTAINING TO THE HOME: Home free of loose throw rugs in walkways, pet beds, electrical cords, etc? Yes  Adequate lighting in your home to reduce risk of falls? Yes   ASSISTIVE DEVICES UTILIZED TO PREVENT FALLS: Life alert? No  Use of a cane, walker or w/c? Yes  Grab bars in the bathroom? Yes  Shower chair or bench in shower? Yes  Elevated toilet seat or a handicapped toilet? No   TIMED UP AND GO: Was the test performed? No .   Cognitive Function:        07/13/2022    1:59 PM  6CIT Screen  What Year? 0 points  What month? 0 points  What time? 0 points  Count back from 20 0 points  Months in reverse 0 points  Repeat phrase 0 points  Total Score 0 points    Immunizations Immunization History  Administered Date(s) Administered    Influenza-Unspecified 07/01/2019, 08/05/2020, 08/11/2021   PFIZER Comirnaty(Gray Top)Covid-19 Tri-Sucrose Vaccine 10/19/2020   PFIZER(Purple Top)SARS-COV-2 Vaccination 01/29/2020, 02/19/2020, 10/19/2020   Pfizer Covid-19 Vaccine Bivalent Booster 31yrs & up 08/11/2021   Tdap 01/15/2020   Zoster Recombinat (Shingrix) 10/23/2020, 01/14/2021   Flu Vaccine status: Due, Education has been provided regarding the importance of this vaccine. Advised may receive this vaccine at local pharmacy or Health Dept. Aware to provide a copy of the vaccination record if obtained from local pharmacy or Health Dept. Verbalized acceptance and understanding.  Covid-19 vaccine status: Completed vaccines x5.  Screening Tests Health Maintenance  Topic Date Due   COVID-19 Vaccine (6 - Pfizer risk series) 07/29/2022 (Originally 10/06/2021)   INFLUENZA VACCINE  01/30/2023 (Originally 06/01/2022)   COLONOSCOPY (Pts 45-5yrs Insurance coverage will need to be confirmed)  04/07/2023 (Originally 07/22/2008)   TETANUS/TDAP  01/14/2030   Hepatitis C Screening  Completed   HIV Screening  Completed   Zoster Vaccines- Shingrix  Completed   HPV VACCINES  Aged Out   Health Maintenance There are no preventive care reminders to display for this patient.  Lung Cancer Screening: (Low Dose CT Chest recommended if Age 28-80 years, 30 pack-year currently smoking OR have quit w/in 15years.) does not qualify.   Vision Screening: Recommended annual ophthalmology exams for early detection of glaucoma and other disorders of the eye.  Dental Screening: Recommended annual dental exams for proper oral hygiene  Community Resource Referral / Chronic Care Management: CRR required this visit?  No   CCM required this visit?  No      Plan:     I have personally reviewed and noted the following in the patient's chart:   Medical and social history Use of alcohol, tobacco or illicit drugs  Current medications and supplements including  opioid prescriptions. Patient is currently taking opioid prescriptions. Information provided to patient regarding non-opioid alternatives. Patient advised to discuss non-opioid treatment plan with their provider. Follow PCP. Functional ability and status Nutritional status Physical activity Advanced directives List of other physicians Hospitalizations, surgeries, and ER visits in previous 12 months Vitals Screenings to include cognitive, depression, and falls Referrals and appointments  In addition, I have reviewed and discussed with patient certain preventive protocols, quality metrics, and best practice recommendations. A written personalized care plan for preventive services as well as general preventive health recommendations were provided to patient.     Ashok Pall, LPN   [id number]

## 2022-08-09 DIAGNOSIS — H2513 Age-related nuclear cataract, bilateral: Secondary | ICD-10-CM | POA: Diagnosis not present

## 2022-08-09 DIAGNOSIS — Z0100 Encounter for examination of eyes and vision without abnormal findings: Secondary | ICD-10-CM | POA: Diagnosis not present

## 2022-08-09 DIAGNOSIS — H43813 Vitreous degeneration, bilateral: Secondary | ICD-10-CM | POA: Diagnosis not present

## 2022-08-09 LAB — HM DIABETES EYE EXAM

## 2022-08-12 ENCOUNTER — Encounter: Payer: Self-pay | Admitting: Internal Medicine

## 2022-08-17 DIAGNOSIS — Z0100 Encounter for examination of eyes and vision without abnormal findings: Secondary | ICD-10-CM | POA: Diagnosis not present

## 2023-01-20 DIAGNOSIS — Z0100 Encounter for examination of eyes and vision without abnormal findings: Secondary | ICD-10-CM | POA: Diagnosis not present

## 2023-04-19 ENCOUNTER — Encounter: Payer: Self-pay | Admitting: Nurse Practitioner

## 2023-04-19 ENCOUNTER — Ambulatory Visit (INDEPENDENT_AMBULATORY_CARE_PROVIDER_SITE_OTHER): Payer: Medicare HMO | Admitting: Nurse Practitioner

## 2023-04-19 VITALS — BP 128/84 | HR 81 | Temp 98.2°F | Ht 70.0 in | Wt 298.4 lb

## 2023-04-19 DIAGNOSIS — E785 Hyperlipidemia, unspecified: Secondary | ICD-10-CM | POA: Diagnosis not present

## 2023-04-19 DIAGNOSIS — R7303 Prediabetes: Secondary | ICD-10-CM

## 2023-04-19 DIAGNOSIS — I2510 Atherosclerotic heart disease of native coronary artery without angina pectoris: Secondary | ICD-10-CM

## 2023-04-19 DIAGNOSIS — I10 Essential (primary) hypertension: Secondary | ICD-10-CM

## 2023-04-19 DIAGNOSIS — M87052 Idiopathic aseptic necrosis of left femur: Secondary | ICD-10-CM

## 2023-04-19 DIAGNOSIS — M87051 Idiopathic aseptic necrosis of right femur: Secondary | ICD-10-CM

## 2023-04-19 MED ORDER — TRAMADOL HCL 50 MG PO TABS: 50.0000 mg | ORAL_TABLET | Freq: Every day | ORAL | 0 refills | Status: AC

## 2023-04-19 NOTE — Progress Notes (Signed)
Established Patient Office Visit  Subjective:  Patient ID: Scott Villanueva, male    DOB: Jul 29, 1961  Age: 62 y.o. MRN: [id number]  CC:  Chief Complaint  Patient presents with   Transitions Of Care    Wants to do labs also    HPI  Scott Villanueva presents for transfer of care. He has h/o hypertension, CAD, hyperlipidemia, prediabetes and avascular necrosis of both hips.  Patient was on percocet due to advised avascular necrosis.  HPI   Past Medical History:  Diagnosis Date   Anginal pain (HCC)    CAD (coronary artery disease)    s/p stent Dr. Algie Coffer; in 40s stents   Difficult intubation    Diverticulitis    Dysrhythmia    HLD (hyperlipidemia)    Hypertension    Myocardial infarction [facility])     Feb.2008   Obesity    Pneumothorax 2006   Sleep apnea    CPAP    Past Surgical History:  Procedure Laterality Date   CARDIAC CATHETERIZATION     with stents   PLEURAL SCARIFICATION Left 2006   SHOULDER ARTHROSCOPY WITH OPEN ROTATOR CUFF REPAIR Left 10/12/2017   Procedure: SHOULDER ARTHROSCOPY WITH OPEN ROTATOR CUFF REPAIR;  Surgeon: Deeann Saint, MD;  Location: [facility] ORS;  Service: Orthopedics;  Laterality: Left;  Left shoulder arthroscopy, subaccromial decompression, distal clavical resection and biceps tenotomy   shoulder joint replacement Left    surgery x 2 Dr. Andrey Campanile ortho   TONSILLECTOMY      Family History  Problem Relation Age of Onset   Depression Mother    Anemia Mother    Thyroid disease Mother    Huntington's disease Mother    CAD Father        s/p bypass    Obesity Father    Huntington's disease Sister    Depression Sister    Schizophrenia Brother    Insomnia Brother    Colon cancer Neg Hx    Esophageal cancer Neg Hx    Pancreatic cancer Neg Hx    Stomach cancer Neg Hx    Liver disease Neg Hx     Social History   Socioeconomic History   Marital status: Married    Spouse name: Not on file   Number of children: Not on file   Years of  education: Not on file   Highest education level: Not on file  Occupational History   Not on file  Tobacco Use   Smoking status: Some Days    Types: Cigarettes    Last attempt to quit: 10/07/2010    Years since quitting: 12.5   Smokeless tobacco: Never  Vaping Use   Vaping Use: Never used  Substance and Sexual Activity   Alcohol use: Yes    Alcohol/week: 6.0 standard drinks of alcohol    Types: 3 Glasses of wine, 3 Cans of beer per week    Comment: daily   Drug use: No   Sexual activity: Yes    Partners: Male  Other Topics Concern   Not on file  Social History Narrative   Currently on disability    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    Married with 3 kids; as of 12/12/19 wife not working due to DM since 07/2019    Smoker    Social Determinants of Health   Financial Resource Strain: Low Risk  (07/13/2022)   Overall Financial Resource Strain (CARDIA)    Difficulty  of Paying Living Expenses: Not hard at all  Food Insecurity: No Food Insecurity (07/13/2022)   Hunger Vital Sign    Worried About Running Out of Food in the Last Year: Never true    Ran Out of Food in the Last Year: Never true  Transportation Needs: No Transportation Needs (07/13/2022)   PRAPARE - Administrator, Civil Service (Medical): No    Lack of Transportation (Non-Medical): No  Physical Activity: Not on file  Stress: No Stress Concern Present (07/13/2022)   [employer] of Occupational Health - Occupational Stress Questionnaire    Feeling of Stress : Not at all  Social Connections: Unknown (07/13/2022)   Social Connection and Isolation Panel [NHANES]    Frequency of Communication with Friends and Family: Not on file    Frequency of Social Gatherings with Friends and Family: Not on file    Attends Religious Services: Not on file    Active Member of Clubs or Organizations: Not on file    Attends Banker Meetings: Not on file     Marital Status: Married  Intimate Partner Violence: Not At Risk (07/13/2022)   Humiliation, Afraid, Rape, and Kick questionnaire    Fear of Current or Ex-Partner: No    Emotionally Abused: No    Physically Abused: No    Sexually Abused: No     Outpatient Medications Prior to Visit  Medication Sig Dispense Refill   albuterol (ACCUNEB) 0.63 MG/3ML nebulizer solution Take 3 mLs (0.63 mg total) by nebulization every 6 (six) hours as needed for wheezing. 360 mL 11   amLODipine (NORVASC) 10 MG tablet Take 1 tablet (10 mg total) by mouth daily. 90 tablet 3   aspirin EC 81 MG tablet Take 1 tablet (81 mg total) by mouth daily. 90 tablet 3   atorvastatin (LIPITOR) 80 MG tablet Take 1 tablet (80 mg total) by mouth daily at 6 PM. D/c 40 90 tablet 3   CALCIUM PO Take by mouth.     Cholecalciferol (VITAMIN D3) 50 MCG (2000 UT) TABS Take 6,000 tablets by mouth.     Ginkgo Biloba 40 MG TABS Take 1 tablet by mouth daily.     losartan (COZAAR) 50 MG tablet Take 1 tablet (50 mg total) by mouth daily. 90 tablet 3   methocarbamol (ROBAXIN) 500 MG tablet Take 1 tablet (500 mg total) by mouth every 8 (eight) hours as needed for muscle spasms. 90 tablet 3   metoprolol succinate (TOPROL-XL) 25 MG 24 hr tablet Take 1 tablet (25 mg total) by mouth daily. 90 tablet 3   Multiple Vitamin (MULTIVITAMIN WITH MINERALS) TABS tablet Take 1 tablet by mouth daily.     Omega-3 Fatty Acids (FISH OIL PO) Take by mouth.     oxyCODONE-acetaminophen (PERCOCET) 7.5-325 MG tablet Take 1 tablet by mouth 2 (two) times daily as needed for severe pain. 20 tablet 0   No facility-administered medications prior to visit.    Allergies  Allergen Reactions   No Known Allergies     ROS Review of Systems Negative unless indicated in HPI.    Objective:    Physical Exam  BP 128/84   Pulse 81   Temp 98.2 F (36.8 C) (Oral)   Ht 5\' 10"  (1.778 m)   Wt 298 lb 6.4 oz (135.4 kg)   SpO2 95%   BMI 42.82 kg/m  Wt Readings from Last  3 Encounters:  04/19/23 298 lb 6.4 oz (135.4 kg)  07/13/22 299 lb (  135.6 kg)  04/06/22 299 lb 6.4 oz (135.8 kg)     Health Maintenance  Topic Date Due   Colonoscopy  Never done   COVID-19 Vaccine (6 - 2023-24 season) 07/02/2022   INFLUENZA VACCINE  06/02/2023   Medicare Annual Wellness (AWV)  07/14/2023   DTaP/Tdap/Td (2 - Td or Tdap) 01/14/2030   Hepatitis C Screening  Completed   HIV Screening  Completed   Zoster Vaccines- Shingrix  Completed   HPV VACCINES  Aged Out    There are no preventive care reminders to display for this patient.  Lab Results  Component Value Date   TSH 2.26 04/06/2022   Lab Results  Component Value Date   WBC 7.6 04/19/2023   HGB 15.6 04/19/2023   HCT 46.6 04/19/2023   MCV 95.2 04/19/2023   PLT 185.0 Repeated and verified X2. 04/19/2023   Lab Results  Component Value Date   NA 140 04/19/2023   K 4.6 04/19/2023   CO2 25 04/19/2023   GLUCOSE 100 (H) 04/19/2023   BUN 16 04/19/2023   CREATININE 1.06 04/19/2023   BILITOT 0.7 04/19/2023   ALKPHOS 65 04/19/2023   AST 17 04/19/2023   ALT 25 04/19/2023   PROT 7.6 04/19/2023   ALBUMIN 4.3 04/19/2023   CALCIUM 9.3 04/19/2023   ANIONGAP 7 09/08/2014   GFR 76.69 04/19/2023   Lab Results  Component Value Date   CHOL 235 (H) 04/19/2023   Lab Results  Component Value Date   HDL 37.70 (L) 04/19/2023   No results found for: "LDLCALC" Lab Results  Component Value Date   TRIG 247.0 (H) 04/19/2023   Lab Results  Component Value Date   CHOLHDL 6 04/19/2023   Lab Results  Component Value Date   HGBA1C 6.2 04/19/2023      Assessment & Plan:  Coronary artery disease involving native coronary artery of native heart without angina pectoris -     Lipid panel -     CBC with Differential/Platelet -     Comprehensive metabolic panel  Primary hypertension Assessment & Plan: Patient BP  Vitals:   04/19/23 1518  BP: 128/84    in the office. Advised pt to follow a low sodium and heart  healthy diet. Continue losartan, metoprolol and amlodipine Labs ordered   Orders: -     Lipid panel -     CBC with Differential/Platelet -     Comprehensive metabolic panel  Prediabetes -     Hemoglobin A1c  Avascular necrosis of bones of both hips (HCC) Assessment & Plan: Managed with medication. Contract signed for tramadol 50 mg once a day   Hyperlipidemia, unspecified hyperlipidemia type Assessment & Plan: Continue atorvastatin for cardiovascular risk reduction. Will check lipid panel.   Other orders -     traMADol HCl; Take 1 tablet (50 mg total) by mouth daily.  Dispense: 30 tablet; Refill: 0 -     traMADol HCl; Take 1 tablet (50 mg total) by mouth daily.  Dispense: 30 tablet; Refill: 0 -     LDL cholesterol, direct    Follow-up: No follow-ups on file.   Kara Dies, NP

## 2023-04-20 ENCOUNTER — Telehealth: Payer: Self-pay | Admitting: Nurse Practitioner

## 2023-04-20 LAB — CBC WITH DIFFERENTIAL/PLATELET
Basophils Absolute: 0 10*3/uL (ref 0.0–0.1)
Basophils Relative: 0.4 % (ref 0.0–3.0)
Eosinophils Absolute: 0.1 10*3/uL (ref 0.0–0.7)
Eosinophils Relative: 0.8 % (ref 0.0–5.0)
HCT: 46.6 % (ref 39.0–52.0)
Hemoglobin: 15.6 g/dL (ref 13.0–17.0)
Lymphocytes Relative: 25.6 % (ref 12.0–46.0)
Lymphs Abs: 2 10*3/uL (ref 0.7–4.0)
MCHC: 33.5 g/dL (ref 30.0–36.0)
MCV: 95.2 fl (ref 78.0–100.0)
Monocytes Absolute: 0.5 10*3/uL (ref 0.1–1.0)
Monocytes Relative: 6.3 % (ref 3.0–12.0)
Neutro Abs: 5.1 10*3/uL (ref 1.4–7.7)
Neutrophils Relative %: 66.9 % (ref 43.0–77.0)
Platelets: 185 10*3/uL (ref 150.0–400.0)
RBC: 4.9 Mil/uL (ref 4.22–5.81)
RDW: 13.1 % (ref 11.5–15.5)
WBC: 7.6 10*3/uL (ref 4.0–10.5)

## 2023-04-20 LAB — HEMOGLOBIN A1C: Hgb A1c MFr Bld: 6.2 % (ref 4.6–6.5)

## 2023-04-20 LAB — COMPREHENSIVE METABOLIC PANEL
ALT: 25 U/L (ref 0–53)
AST: 17 U/L (ref 0–37)
Albumin: 4.3 g/dL (ref 3.5–5.2)
Alkaline Phosphatase: 65 U/L (ref 39–117)
BUN: 16 mg/dL (ref 6–23)
CO2: 25 mEq/L (ref 19–32)
Calcium: 9.3 mg/dL (ref 8.4–10.5)
Chloride: 105 mEq/L (ref 96–112)
Creatinine, Ser: 1.06 mg/dL (ref 0.40–1.50)
GFR: 76.69 mL/min (ref >60.00)
Glucose, Bld: 100 mg/dL — ABNORMAL HIGH (ref 70–99)
Potassium: 4.6 mEq/L (ref 3.5–5.1)
Sodium: 140 mEq/L (ref 135–145)
Total Bilirubin: 0.7 mg/dL (ref 0.2–1.2)
Total Protein: 7.6 g/dL (ref 6.0–8.3)

## 2023-04-20 LAB — LIPID PANEL
Cholesterol: 235 mg/dL — ABNORMAL HIGH (ref 0–200)
HDL: 37.7 mg/dL — ABNORMAL LOW (ref >39.00)
NonHDL: 197.28
Total CHOL/HDL Ratio: 6
Triglycerides: 247 mg/dL — ABNORMAL HIGH (ref 0.0–149.0)
VLDL: 49.4 mg/dL — ABNORMAL HIGH (ref 0.0–40.0)

## 2023-04-20 LAB — LDL CHOLESTEROL, DIRECT: Direct LDL: 165 mg/dL

## 2023-04-20 NOTE — Telephone Encounter (Signed)
Pt called in stating that [facility] its giving him a hard time with med Tramadol. As per pt, his insurance wont pay for it unless provider write a xr for 7 days first, then write another one for 30 days as previous.   Pt stated that [facility] has faxed over some paperwork for provider to fill in order to get this med. Pt stated he would like for someone to help him with this issue.

## 2023-04-21 ENCOUNTER — Encounter: Payer: Self-pay | Admitting: Nurse Practitioner

## 2023-04-21 NOTE — Telephone Encounter (Signed)
Can you please call the patient.

## 2023-04-21 NOTE — Telephone Encounter (Signed)
Pt called in regarding previous message. As per pt, [employer] have some questions about pt before paying for Tramadol. Their contact # is [phone].

## 2023-04-21 NOTE — Telephone Encounter (Signed)
Prior authorization form completed, signed by Kara Dies, NP and faxed to [employer]   Phone call to pt to make him aware.

## 2023-04-30 NOTE — Assessment & Plan Note (Signed)
Managed with medication. Contract signed for tramadol 50 mg once a day

## 2023-04-30 NOTE — Assessment & Plan Note (Addendum)
Continue atorvastatin for cardiovascular risk reduction. Will check lipid panel.

## 2023-04-30 NOTE — Assessment & Plan Note (Signed)
Patient BP  Vitals:   04/19/23 1518  BP: 128/84    in the office. Advised pt to follow a low sodium and heart healthy diet. Continue losartan, metoprolol and amlodipine Labs ordered

## 2023-07-19 ENCOUNTER — Ambulatory Visit (INDEPENDENT_AMBULATORY_CARE_PROVIDER_SITE_OTHER): Payer: Medicare HMO | Admitting: *Deleted

## 2023-07-19 VITALS — Ht 70.0 in | Wt 285.0 lb

## 2023-07-19 DIAGNOSIS — Z0000 Encounter for general adult medical examination without abnormal findings: Secondary | ICD-10-CM | POA: Diagnosis not present

## 2023-07-19 NOTE — Progress Notes (Signed)
Subjective:   Scott Villanueva is a 62 y.o. male who presents for Medicare Annual/Subsequent preventive examination.  Visit Complete: Virtual  I connected with  Scott Villanueva on 07/19/23 by a audio enabled telemedicine application and verified that I am speaking with the correct person using two identifiers.  Patient Location: Home  Provider Location: Home Office  I discussed the limitations of evaluation and management by telemedicine. The patient expressed understanding and agreed to proceed.  Vital Signs: Unable to obtain new vitals due to this being a telehealth visit.   Cardiac Risk Factors include: advanced age (>9men, >54 women);male gender;dyslipidemia;hypertension;obesity (BMI >30kg/m2);smoking/ tobacco exposure;sedentary lifestyle;Other (see comment), Risk factor comments: CAD     Objective:    Today's Vitals   07/19/23 1247  Weight: 285 lb (129.3 kg)  Height: 5\' 10"  (1.778 m)   Body mass index is 40.89 kg/m.     07/19/2023    1:11 PM 07/13/2022    1:17 PM 03/20/2021   10:50 AM 10/07/2017   12:10 PM  Advanced Directives  Does Patient Have a Medical Advance Directive? No No No No  Does patient want to make changes to medical advance directive?   No - Patient declined   Would patient like information on creating a medical advance directive? No - Patient declined No - Patient declined  Yes (MAU/Ambulatory/Procedural Areas - Information given)    Current Medications (verified) Outpatient Encounter Medications as of 07/19/2023  Medication Sig   Acetylcysteine (NAC PO) Take by mouth. Take one daily   albuterol (ACCUNEB) 0.63 MG/3ML nebulizer solution Take 3 mLs (0.63 mg total) by nebulization every 6 (six) hours as needed for wheezing.   amLODipine (NORVASC) 10 MG tablet Take 1 tablet (10 mg total) by mouth daily.   aspirin EC 81 MG tablet Take 1 tablet (81 mg total) by mouth daily.   atorvastatin (LIPITOR) 80 MG tablet Take 1 tablet (80 mg total) by mouth daily  at 6 PM. D/c 40   Boswellia-Glucosamine-Vit D (OSTEO BI-FLEX ONE PER DAY PO) Take by mouth. Take two by mouth daily   CALCIUM PO Take by mouth.   Cholecalciferol (VITAMIN D3) 50 MCG (2000 UT) TABS Take 6,000 tablets by mouth.   Ginkgo Biloba 40 MG TABS Take 1 tablet by mouth daily.   losartan (COZAAR) 50 MG tablet Take 1 tablet (50 mg total) by mouth daily.   methocarbamol (ROBAXIN) 500 MG tablet Take 1 tablet (500 mg total) by mouth every 8 (eight) hours as needed for muscle spasms.   metoprolol succinate (TOPROL-XL) 25 MG 24 hr tablet Take 1 tablet (25 mg total) by mouth daily.   Multiple Vitamin (MULTIVITAMIN WITH MINERALS) TABS tablet Take 1 tablet by mouth daily.   Omega-3 Fatty Acids (FISH OIL PO) Take by mouth.   oxyCODONE-acetaminophen (PERCOCET) 7.5-325 MG tablet Take 1 tablet by mouth 2 (two) times daily as needed for severe pain.   traMADol (ULTRAM) 50 MG tablet Take by mouth daily as needed.   Turmeric 500 MG CAPS Take by mouth daily.   No facility-administered encounter medications on file as of 07/19/2023.    Allergies (verified) No known allergies   History: Past Medical History:  Diagnosis Date   Anginal pain (HCC)    CAD (coronary artery disease)    s/p stent Dr. Algie Coffer; in 40s stents   Difficult intubation    Diverticulitis    Dysrhythmia    HLD (hyperlipidemia)    Hypertension    Myocardial infarction (HCC)  Feb.2008   Obesity    Pneumothorax 2006   Sleep apnea    CPAP   Past Surgical History:  Procedure Laterality Date   CARDIAC CATHETERIZATION     with stents   PLEURAL SCARIFICATION Left 2006   SHOULDER ARTHROSCOPY WITH OPEN ROTATOR CUFF REPAIR Left 10/12/2017   Procedure: SHOULDER ARTHROSCOPY WITH OPEN ROTATOR CUFF REPAIR;  Surgeon: Deeann Saint, MD;  Location: [facility] ORS;  Service: Orthopedics;  Laterality: Left;  Left shoulder arthroscopy, subaccromial decompression, distal clavical resection and biceps tenotomy   shoulder joint replacement Left     surgery x 2 Dr. Andrey Campanile ortho   TONSILLECTOMY     Family History  Problem Relation Age of Onset   Depression Mother    Anemia Mother    Thyroid disease Mother    Huntington's disease Mother    CAD Father        s/p bypass    Obesity Father    Huntington's disease Sister    Depression Sister    Schizophrenia Brother    Insomnia Brother    Colon cancer Neg Hx    Esophageal cancer Neg Hx    Pancreatic cancer Neg Hx    Stomach cancer Neg Hx    Liver disease Neg Hx    Social History   Socioeconomic History   Marital status: Married    Spouse name: Not on file   Number of children: Not on file   Years of education: Not on file   Highest education level: Not on file  Occupational History   Not on file  Tobacco Use   Smoking status: Some Days    Current packs/day: 0.00    Types: Cigarettes    Last attempt to quit: 10/07/2010    Years since quitting: 12.7   Smokeless tobacco: Never  Vaping Use   Vaping status: Never Used  Substance and Sexual Activity   Alcohol use: Yes    Alcohol/week: 6.0 standard drinks of alcohol    Types: 3 Glasses of wine, 3 Cans of beer per week    Comment: daily   Drug use: No   Sexual activity: Yes    Partners: Male  Other Topics Concern   Not on file  Social History Narrative   Currently on disability    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    Married with 3 kids; as of 12/12/19 wife not working due to DM since 07/2019    Smoker    Social Determinants of Health   Financial Resource Strain: Low Risk  (07/19/2023)   Overall Financial Resource Strain (CARDIA)    Difficulty of Paying Living Expenses: Not hard at all  Food Insecurity: No Food Insecurity (07/19/2023)   Hunger Vital Sign    Worried About Running Out of Food in the Last Year: Never true    Ran Out of Food in the Last Year: Never true  Transportation Needs: No Transportation Needs (07/19/2023)   PRAPARE - Scientist, research (physical sciences) (Medical): No    Lack of Transportation (Non-Medical): No  Physical Activity: Inactive (07/19/2023)   Exercise Vital Sign    Days of Exercise per Week: 0 days    Minutes of Exercise per Session: 0 min  Stress: Stress Concern Present (07/19/2023)   [employer] of Occupational Health - Occupational Stress Questionnaire    Feeling of Stress : To some extent  Social Connections: Moderately Isolated (07/19/2023)   Social  Connection and Isolation Panel [NHANES]    Frequency of Communication with Friends and Family: More than three times a week    Frequency of Social Gatherings with Friends and Family: More than three times a week    Attends Religious Services: Never    Database administrator or Organizations: No    Attends Engineer, structural: Never    Marital Status: Married    Tobacco Counseling Ready to quit: Not Answered Counseling given: Not Answered   Clinical Intake:  Pre-visit preparation completed: Yes  Pain : No/denies pain     BMI - recorded: 40.89 Nutritional Status: BMI > 30  Obese Nutritional Risks: None Diabetes: No  How often do you need to have someone help you when you read instructions, pamphlets, or other written materials from your doctor or pharmacy?: 1 - Never  Interpreter Needed?: No  Information entered by :: R. Laverle Pillard LPN   Activities of Daily Living    07/19/2023   12:50 PM  In your present state of health, do you have any difficulty performing the following activities:  Hearing? 0  Vision? 0  Comment glasses  Difficulty concentrating or making decisions? 1  Walking or climbing stairs? 1  Dressing or bathing? 0  Doing errands, shopping? 0  Preparing Food and eating ? N  Using the Toilet? N  In the past six months, have you accidently leaked urine? Y  Comment a little  Do you have problems with loss of bowel control? Y  Managing your Medications? N  Managing your Finances? N  Housekeeping or managing your  Housekeeping? N    Patient Care Team: Kara Dies, NP as PCP - General (Nurse Practitioner)  Indicate any recent Medical Services you may have received from other than [facility] providers in the past year (date may be approximate).     Assessment:   This is a routine wellness examination for Uzziel.  Hearing/Vision screen Hearing Screening - Comments:: No issues Vision Screening - Comments:: glasses   Goals Addressed             This Visit's Progress    Patient Stated       Wants to continue to lose weifht       Depression Screen    07/19/2023    1:03 PM 04/19/2023    3:20 PM 07/13/2022    1:12 PM 04/06/2022    8:17 AM 03/20/2021   10:45 AM 12/17/2020    1:23 PM  PHQ 2/9 Scores  PHQ - 2 Score 1 0 0 0 0 1  PHQ- 9 Score 5     1    Fall Risk    07/19/2023   12:54 PM 04/19/2023    3:19 PM 07/13/2022    1:18 PM 04/06/2022    8:16 AM 09/01/2021    9:38 AM  Fall Risk   Falls in the past year? 0 0 0 0 0  Number falls in past yr: 0 0 0 0 0  Injury with Fall? 0 0  0 0  Risk for fall due to : No Fall Risks No Fall Risks Impaired balance/gait No Fall Risks No Fall Risks  Risk for fall due to: Comment   Ambulates with cane and/or crutch    Follow up Falls prevention discussed;Falls evaluation completed Falls evaluation completed Falls evaluation completed Falls evaluation completed Falls evaluation completed    MEDICARE RISK AT HOME: Medicare Risk at Home Any stairs in or around the home?: Yes If  so, are there any without handrails?: No Home free of loose throw rugs in walkways, pet beds, electrical cords, etc?: Yes Adequate lighting in your home to reduce risk of falls?: Yes Life alert?: No Use of a cane, walker or w/c?: Yes Grab bars in the bathroom?: Yes Shower chair or bench in shower?: Yes Elevated toilet seat or a handicapped toilet?: No  T Cognitive Function:        07/19/2023    1:11 PM 07/13/2022    1:59 PM  6CIT Screen  What Year? 0 points 0 points   What month? 0 points 0 points  What time? 0 points 0 points  Count back from 20 0 points 0 points  Months in reverse 0 points 0 points  Repeat phrase 2 points 0 points  Total Score 2 points 0 points    Immunizations Immunization History  Administered Date(s) Administered   Influenza-Unspecified 07/01/2019, 08/05/2020, 08/11/2021   PFIZER(Purple Top)SARS-COV-2 Vaccination 01/29/2020, 02/19/2020, 10/19/2020   Pfizer Covid-19 Vaccine Bivalent Booster 41yrs & up 08/11/2021   Tdap 01/15/2020   Zoster Recombinant(Shingrix) 10/23/2020, 01/14/2021    TDAP status: Up to date  Flu Vaccine status: Due, Education has been provided regarding the importance of this vaccine. Advised may receive this vaccine at local pharmacy or Health Dept. Aware to provide a copy of the vaccination record if obtained from local pharmacy or Health Dept. Verbalized acceptance and understanding.  Pneumococcal vaccine status: Up to date  Covid-19 vaccine status: Information provided on how to obtain vaccines.   Qualifies for Shingles Vaccine? Yes   Zostavax completed No   Shingrix Completed?: Yes  Screening Tests Health Maintenance  Topic Date Due   Colonoscopy  Never done   INFLUENZA VACCINE  06/02/2023   COVID-19 Vaccine (5 - 2023-24 season) 07/03/2023   Medicare Annual Wellness (AWV)  07/14/2023   DTaP/Tdap/Td (2 - Td or Tdap) 01/14/2030   Hepatitis C Screening  Completed   HIV Screening  Completed   Zoster Vaccines- Shingrix  Completed   HPV VACCINES  Aged Out    Health Maintenance  Health Maintenance Due  Topic Date Due   Colonoscopy  Never done   INFLUENZA VACCINE  06/02/2023   COVID-19 Vaccine (5 - 2023-24 season) 07/03/2023   Medicare Annual Wellness (AWV)  07/14/2023    Colorectal cancer screening: Type of screening: Cologuard. Completed 1/22. Repeat every 3 years  Lung Cancer Screening: (Low Dose CT Chest recommended if Age 51-80 years, 20 pack-year currently smoking OR have quit  w/in 15years.) does qualify.   Patient needs to discuss with PCP.at next visit    Additional Screening:  Hepatitis C Screening: does qualify; Completed 3/21  Vision Screening: Recommended annual ophthalmology exams for early detection of glaucoma and other disorders of the eye. Is the patient up to date with their annual eye exam?  Yes  Who is the provider or what is the name of the office in which the patient attends annual eye exams?  Eye If pt is not established with a provider, would they like to be referred to a provider to establish care? No .   Dental Screening: Recommended annual dental exams for proper oral hygiene    Community Resource Referral / Chronic Care Management: CRR required this visit?  No   CCM required this visit?  No     Plan:     I have personally reviewed and noted the following in the patient's chart:   Medical and social history Use of alcohol,  tobacco or illicit drugs  Current medications and supplements including opioid prescriptions. Patient is currently taking opioid prescriptions. Information provided to patient regarding non-opioid alternatives. Patient advised to discuss non-opioid treatment plan with their provider. Functional ability and status Nutritional status Physical activity Advanced directives List of other physicians Hospitalizations, surgeries, and ER visits in previous 12 months Vitals Screenings to include cognitive, depression, and falls Referrals and appointments  In addition, I have reviewed and discussed with patient certain preventive protocols, quality metrics, and best practice recommendations. A written personalized care plan for preventive services as well as general preventive health recommendations were provided to patient.     Sydell Axon, LPN   [id number]   After Visit Summary: (MyChart) Due to this being a telephonic visit, the after visit summary with patients personalized plan was offered to patient  via MyChart   Nurse Notes: None

## 2023-07-19 NOTE — Patient Instructions (Signed)
[name] , Thank you for taking time to come for your Medicare Wellness Visit. I appreciate your ongoing commitment to your health goals. Please review the following plan we discussed and let me know if I can assist you in the future.   Referrals/Orders/Follow-Ups/Clinician Recommendations: None  This is a list of the screening recommended for you and due dates:  Health Maintenance  Topic Date Due   Flu Shot  06/02/2023   COVID-19 Vaccine (5 - 2023-24 season) 07/03/2023   Cologuard (Stool DNA test)  11/04/2023   Medicare Annual Wellness Visit  07/18/2024   DTaP/Tdap/Td vaccine (2 - Td or Tdap) 01/14/2030   Hepatitis C Screening  Completed   HIV Screening  Completed   Zoster (Shingles) Vaccine  Completed   HPV Vaccine  Aged Out    Advanced directives: (Declined) Advance directive discussed with you today. Even though you declined this today, please call our office should you change your mind, and we can give you the proper paperwork for you to fill out.  Next Medicare Annual Wellness Visit scheduled for next year: Yes 07/23/24 @ 12:45   Managing Pain Without Opioids Opioids are strong medicines used to treat moderate to severe pain. For some people, especially those who have long-term (chronic) pain, opioids may not be the best choice for pain management due to: Side effects like nausea, constipation, and sleepiness. The risk of addiction (opioid use disorder). The longer you take opioids, the greater your risk of addiction. Pain that lasts for more than 3 months is called chronic pain. Managing chronic pain usually requires more than one approach and is often provided by a team of health care providers working together (multidisciplinary approach). Pain management may be done at a pain management center or pain clinic. How to manage pain without the use of opioids Use non-opioid medicines Non-opioid medicines for pain may include: Over-the-counter or prescription non-steroidal  anti-inflammatory drugs (NSAIDs). These may be the first medicines used for pain. They work well for muscle and bone pain, and they reduce swelling. Acetaminophen. This over-the-counter medicine may work well for milder pain but not swelling. Antidepressants. These may be used to treat chronic pain. A certain type of antidepressant (tricyclics) is often used. These medicines are given in lower doses for pain than when used for depression. Anticonvulsants. These are usually used to treat seizures but may also reduce nerve (neuropathic) pain. Muscle relaxants. These relieve pain caused by sudden muscle tightening (spasms). You may also use a pain medicine that is applied to the skin as a patch, cream, or gel (topical analgesic), such as a numbing medicine. These may cause fewer side effects than medicines taken by mouth. Do certain therapies as directed Some therapies can help with pain management. They include: Physical therapy. You will do exercises to gain strength and flexibility. A physical therapist may teach you exercises to move and stretch parts of your body that are weak, stiff, or painful. You can learn these exercises at physical therapy visits and practice them at home. Physical therapy may also involve: Massage. Heat wraps or applying heat or cold to affected areas. Electrical signals that interrupt pain signals (transcutaneous electrical nerve stimulation, TENS). Weak lasers that reduce pain and swelling (low-level laser therapy). Signals from your body that help you learn to regulate pain (biofeedback). Occupational therapy. This helps you to learn ways to function at home and work with less pain. Recreational therapy. This involves trying new activities or hobbies, such as a physical activity or  drawing. Mental health therapy, including: Cognitive behavioral therapy (CBT). This helps you learn coping skills for dealing with pain. Acceptance and commitment therapy (ACT) to change the  way you think and react to pain. Relaxation therapies, including muscle relaxation exercises and mindfulness-based stress reduction. Pain management counseling. This may be individual, family, or group counseling.  Receive medical treatments Medical treatments for pain management include: Nerve block injections. These may include a pain blocker and anti-inflammatory medicines. You may have injections: Near the spine to relieve chronic back or neck pain. Into joints to relieve back or joint pain. Into nerve areas that supply a painful area to relieve body pain. Into muscles (trigger point injections) to relieve some painful muscle conditions. A medical device placed near your spine to help block pain signals and relieve nerve pain or chronic back pain (spinal cord stimulation device). Acupuncture. Follow these instructions at home Medicines Take over-the-counter and prescription medicines only as told by your health care provider. If you are taking pain medicine, ask your health care providers about possible side effects to watch out for. Do not drive or use heavy machinery while taking prescription opioid pain medicine. Lifestyle  Do not use drugs or alcohol to reduce pain. If you drink alcohol, limit how much you have to: 0-1 drink a day for women who are not pregnant. 0-2 drinks a day for men. Know how much alcohol is in a drink. In the [place] one drink equals one 12 oz bottle of beer (355 mL), one 5 oz glass of wine (148 mL), or one 1 oz glass of hard liquor (44 mL). Do not use any products that contain nicotine or tobacco. These products include cigarettes, chewing tobacco, and vaping devices, such as e-cigarettes. If you need help quitting, ask your health care provider. Eat a healthy diet and maintain a healthy weight. Poor diet and excess weight may make pain worse. Eat foods that are high in fiber. These include fresh fruits and vegetables, whole grains, and beans. Limit foods that  are high in fat and processed sugars, such as fried and sweet foods. Exercise regularly. Exercise lowers stress and may help relieve pain. Ask your health care provider what activities and exercises are safe for you. If your health care provider approves, join an exercise class that combines movement and stress reduction. Examples include yoga and tai chi. Get enough sleep. Lack of sleep may make pain worse. Lower stress as much as possible. Practice stress reduction techniques as told by your therapist. General instructions Work with all your pain management providers to find the treatments that work best for you. You are an important member of your pain management team. There are many things you can do to reduce pain on your own. Consider joining an online or in-person support group for people who have chronic pain. Keep all follow-up visits. This is important. Where to find more information You can find more information about managing pain without opioids from: American Academy of Pain Medicine: painmed.org Institute for Chronic Pain: instituteforchronicpain.org American Chronic Pain Association: theacpa.org Contact a health care provider if: You have side effects from pain medicine. Your pain gets worse or does not get better with treatments or home therapy. You are struggling with anxiety or depression. Summary Many types of pain can be managed without opioids. Chronic pain may respond better to pain management without opioids. Pain is best managed when you and a team of health care providers work together. Pain management without opioids may include non-opioid  medicines, medical treatments, physical therapy, mental health therapy, and lifestyle changes. Tell your health care providers if your pain gets worse or is not being managed well enough. This information is not intended to replace advice given to you by your health care provider. Make sure you discuss any questions you have with  your health care provider. Document Revised: 01/28/2021 Document Reviewed: 01/28/2021 Elsevier Patient Education  2024 [employer].

## 2023-08-28 ENCOUNTER — Other Ambulatory Visit: Payer: Self-pay | Admitting: Nurse Practitioner

## 2023-08-28 DIAGNOSIS — M87051 Idiopathic aseptic necrosis of right femur: Secondary | ICD-10-CM

## 2023-09-01 ENCOUNTER — Other Ambulatory Visit: Payer: Self-pay

## 2023-09-01 MED ORDER — TRAMADOL HCL 50 MG PO TABS
50.0000 mg | ORAL_TABLET | Freq: Every day | ORAL | 1 refills | Status: DC | PRN
  Filled 2023-09-01: qty 30, 30d supply, fill #0
  Filled 2023-11-05: qty 30, 30d supply, fill #1

## 2023-10-20 ENCOUNTER — Encounter: Payer: Self-pay | Admitting: Nurse Practitioner

## 2023-10-20 ENCOUNTER — Ambulatory Visit: Payer: Medicare HMO | Admitting: Nurse Practitioner

## 2023-10-20 VITALS — BP 132/86 | HR 76 | Temp 98.8°F | Ht 70.0 in | Wt 297.0 lb

## 2023-10-20 DIAGNOSIS — M87051 Idiopathic aseptic necrosis of right femur: Secondary | ICD-10-CM | POA: Diagnosis not present

## 2023-10-20 DIAGNOSIS — Z23 Encounter for immunization: Secondary | ICD-10-CM

## 2023-10-20 DIAGNOSIS — E669 Obesity, unspecified: Secondary | ICD-10-CM

## 2023-10-20 DIAGNOSIS — I10 Essential (primary) hypertension: Secondary | ICD-10-CM

## 2023-10-20 DIAGNOSIS — M6289 Other specified disorders of muscle: Secondary | ICD-10-CM

## 2023-10-20 DIAGNOSIS — E785 Hyperlipidemia, unspecified: Secondary | ICD-10-CM

## 2023-10-20 DIAGNOSIS — M87052 Idiopathic aseptic necrosis of left femur: Secondary | ICD-10-CM

## 2023-10-20 DIAGNOSIS — R7303 Prediabetes: Secondary | ICD-10-CM

## 2023-10-20 DIAGNOSIS — I2510 Atherosclerotic heart disease of native coronary artery without angina pectoris: Secondary | ICD-10-CM | POA: Diagnosis not present

## 2023-10-20 DIAGNOSIS — Z125 Encounter for screening for malignant neoplasm of prostate: Secondary | ICD-10-CM | POA: Diagnosis not present

## 2023-10-20 LAB — COMPREHENSIVE METABOLIC PANEL
ALT: 27 U/L (ref 0–53)
AST: 20 U/L (ref 0–37)
Albumin: 4.7 g/dL (ref 3.5–5.2)
Alkaline Phosphatase: 71 U/L (ref 39–117)
BUN: 13 mg/dL (ref 6–23)
CO2: 26 meq/L (ref 19–32)
Calcium: 9.5 mg/dL (ref 8.4–10.5)
Chloride: 102 meq/L (ref 96–112)
Creatinine, Ser: 1 mg/dL (ref 0.40–1.50)
GFR: 81.96 mL/min (ref >60.00)
Glucose, Bld: 118 mg/dL — ABNORMAL HIGH (ref 70–99)
Potassium: 4.4 meq/L (ref 3.5–5.1)
Sodium: 141 meq/L (ref 135–145)
Total Bilirubin: 0.8 mg/dL (ref 0.2–1.2)
Total Protein: 7.5 g/dL (ref 6.0–8.3)

## 2023-10-20 LAB — LIPID PANEL
Cholesterol: 264 mg/dL — ABNORMAL HIGH (ref 0–200)
HDL: 41.1 mg/dL (ref >39.00)
LDL Cholesterol: 175 mg/dL — ABNORMAL HIGH (ref 0–99)
NonHDL: 222.62
Total CHOL/HDL Ratio: 6
Triglycerides: 237 mg/dL — ABNORMAL HIGH (ref 0.0–149.0)
VLDL: 47.4 mg/dL — ABNORMAL HIGH (ref 0.0–40.0)

## 2023-10-20 LAB — MICROALBUMIN / CREATININE URINE RATIO
Creatinine,U: 115.2 mg/dL
Microalb Creat Ratio: 0.6 mg/g (ref 0.0–30.0)
Microalb, Ur: 0.7 mg/dL (ref 0.0–1.9)

## 2023-10-20 LAB — HEMOGLOBIN A1C: Hgb A1c MFr Bld: 6.5 % (ref 4.6–6.5)

## 2023-10-20 LAB — TSH: TSH: 1.95 u[IU]/mL (ref 0.35–5.50)

## 2023-10-20 LAB — LDL CHOLESTEROL, DIRECT: Direct LDL: 199 mg/dL

## 2023-10-20 LAB — PSA: PSA: 1.49 ng/mL (ref 0.10–4.00)

## 2023-10-20 NOTE — Progress Notes (Signed)
Established Patient Office Visit  Subjective:  Patient ID: Scott Villanueva, male    DOB: Mar 26, 1961  Age: 62 y.o. MRN: [id number]  CC:  Chief Complaint  Patient presents with   Medical Management of Chronic Issues    HPI  Scott Villanueva presents for chronic disease follow-up.  History of hypertension, CAD, hyperlipidemia, avascular necrosis of hip, OSA and prediabetes   Pt reports not taking the BP medication continuously. He states that the BP medication makes him drowsy. He states that he takes BP medication when he has palpitation. He does not check the BP daily.  He takes BP medication 3 times a week as needed. Pt states that he do not need refill on the medications at present.   He has history of CAD he is due for follow-up with the cardiology.  Avascular necrosis of both hip due to steroid injections.  Patient takes tramadol for pain control and uses CBD powder. He states that pain is is like muscle tightness near the knee and thigh area bilaterally.   HPI   Past Medical History:  Diagnosis Date   Anginal pain (HCC)    CAD (coronary artery disease)    s/p stent Dr. Algie Coffer; in 40s stents   Difficult intubation    Diverticulitis    Dysrhythmia    HLD (hyperlipidemia)    Hypertension    Myocardial infarction [facility])     Feb.2008   Obesity    Pneumothorax 2006   Sleep apnea    CPAP    Past Surgical History:  Procedure Laterality Date   CARDIAC CATHETERIZATION     with stents   PLEURAL SCARIFICATION Left 2006   SHOULDER ARTHROSCOPY WITH OPEN ROTATOR CUFF REPAIR Left 10/12/2017   Procedure: SHOULDER ARTHROSCOPY WITH OPEN ROTATOR CUFF REPAIR;  Surgeon: Deeann Saint, MD;  Location: [facility] ORS;  Service: Orthopedics;  Laterality: Left;  Left shoulder arthroscopy, subaccromial decompression, distal clavical resection and biceps tenotomy   shoulder joint replacement Left    surgery x 2 Dr. Andrey Campanile ortho   TONSILLECTOMY      Family History  Problem Relation Age of  Onset   Depression Mother    Anemia Mother    Thyroid disease Mother    Huntington's disease Mother    CAD Father        s/p bypass    Obesity Father    Huntington's disease Sister    Depression Sister    Schizophrenia Brother    Insomnia Brother    Colon cancer Neg Hx    Esophageal cancer Neg Hx    Pancreatic cancer Neg Hx    Stomach cancer Neg Hx    Liver disease Neg Hx     Social History   Socioeconomic History   Marital status: Married    Spouse name: Not on file   Number of children: Not on file   Years of education: Not on file   Highest education level: Associate degree: occupational, Scientist, product/process development, or vocational program  Occupational History   Not on file  Tobacco Use   Smoking status: Some Days    Current packs/day: 0.00    Types: Cigarettes    Last attempt to quit: 10/07/2010    Years since quitting: 13.0   Smokeless tobacco: Never   Tobacco comments:    Smoke some days 1 pack last for 1- 2 months.  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    Married with 3 kids; as of 12/12/19 wife not working due to DM since 07/2019    Smoker    Social Drivers of Health   Financial Resource Strain: Low Risk  (10/19/2023)   Overall Financial Resource Strain (CARDIA)    Difficulty of Paying Living Expenses: Not very hard  Food Insecurity: No Food Insecurity (10/19/2023)   Hunger Vital Sign    Worried About Running Out of Food in the Last Year: Never true    Ran Out of Food in the Last Year: Never true  Transportation Needs: No Transportation Needs (10/19/2023)   PRAPARE -  Administrator, Civil Service (Medical): No    Lack of Transportation (Non-Medical): No  Physical Activity: Insufficiently Active (10/19/2023)   Exercise Vital Sign    Days of Exercise per Week: 2 days    Minutes of Exercise per Session: 20 min  Stress: Stress Concern Present (10/19/2023)   [employer] of Occupational Health - Occupational Stress Questionnaire    Feeling of Stress : To some extent  Social Connections: Moderately Integrated (10/19/2023)   Social Connection and Isolation Panel [NHANES]    Frequency of Communication with Friends and Family: More than three times a week    Frequency of Social Gatherings with Friends and Family: More than three times a week    Attends Religious Services: More than 4 times per year    Active Member of [employer] or Organizations: No    Attends Banker Meetings: Never    Marital Status: Married  Catering manager Violence: Not At Risk (07/19/2023)   Humiliation, Afraid, Rape, and Kick questionnaire    Fear of Current or Ex-Partner: No    Emotionally Abused: No    Physically Abused: No    Sexually Abused: No     Outpatient Medications Prior to Visit  Medication Sig Dispense Refill   Acetylcysteine (NAC PO) Take by mouth. Take one daily     albuterol (ACCUNEB) 0.63 MG/3ML nebulizer solution Take 3 mLs (0.63 mg total) by nebulization every 6 (six) hours as needed for wheezing. 360 mL 11   amLODipine (NORVASC) 10 MG tablet Take 1 tablet (10 mg total) by mouth daily. 90 tablet 3   aspirin EC 81 MG tablet Take 1 tablet (81 mg total) by mouth daily. 90 tablet 3   atorvastatin (LIPITOR) 80 MG tablet Take 1 tablet (80 mg total) by mouth daily at 6 PM. D/c 40 90 tablet 3   Boswellia-Glucosamine-Vit D (OSTEO BI-FLEX ONE PER DAY PO) Take by mouth. Take two by mouth daily     CALCIUM PO Take by mouth.     Cholecalciferol (VITAMIN D3) 50 MCG (2000 UT) TABS Take 6,000 tablets by mouth.     Ginkgo Biloba 40 MG TABS Take 1 tablet  by mouth daily.     losartan (COZAAR) 50 MG tablet Take 1 tablet (50 mg total) by mouth daily. 90 tablet 3   methocarbamol (ROBAXIN) 500 MG tablet Take 1 tablet (500 mg total) by mouth every 8 (eight)  hours as needed for muscle spasms. 90 tablet 3   metoprolol succinate (TOPROL-XL) 25 MG 24 hr tablet Take 1 tablet (25 mg total) by mouth daily. 90 tablet 3   Multiple Vitamin (MULTIVITAMIN WITH MINERALS) TABS tablet Take 1 tablet by mouth daily.     Omega-3 Fatty Acids (FISH OIL PO) Take by mouth.     traMADol (ULTRAM) 50 MG tablet Take 1 tablet (50 mg total) by mouth daily as needed. 30 tablet 1   Turmeric 500 MG CAPS Take by mouth daily.     No facility-administered medications prior to visit.    Allergies  Allergen Reactions   No Known Allergies     ROS Review of Systems Negative unless indicated in HPI.    Objective:    Physical Exam Constitutional:      Appearance: Normal appearance. He is obese.  HENT:     Right Ear: Tympanic membrane normal.     Left Ear: Tympanic membrane normal.     Nose: Nose normal.     Mouth/Throat:     Mouth: Mucous membranes are moist.  Cardiovascular:     Rate and Rhythm: Normal rate and regular rhythm.     Pulses: Normal pulses.     Heart sounds: Normal heart sounds.  Pulmonary:     Effort: Pulmonary effort is normal.     Breath sounds: Normal breath sounds.  Abdominal:     General: Bowel sounds are normal. There is no distension.     Palpations: Abdomen is soft.  Musculoskeletal:        General: Tenderness present.  Skin:    General: Skin is warm.     Findings: No bruising.  Neurological:     General: No focal deficit present.     Mental Status: He is alert and oriented to person, place, and time.     Gait: Gait abnormal.     BP 132/86   Pulse 76   Temp 98.8 F (37.1 C)   Ht 5\' 10"  (1.778 m)   Wt 297 lb (134.7 kg)   SpO2 96%   BMI 42.62 kg/m  Wt Readings from Last 3 Encounters:  10/20/23 297 lb (134.7 kg)  07/19/23 285  lb (129.3 kg)  04/19/23 298 lb 6.4 oz (135.4 kg)     Health Maintenance  Topic Date Due   COVID-19 Vaccine (6 - 2024-25 season) 11/05/2023 (Originally 07/03/2023)   Fecal DNA (Cologuard)  11/04/2023   Medicare Annual Wellness (AWV)  07/18/2024   DTaP/Tdap/Td (2 - Td or Tdap) 01/14/2030   INFLUENZA VACCINE  Completed   Hepatitis C Screening  Completed   HIV Screening  Completed   Zoster Vaccines- Shingrix  Completed   HPV VACCINES  Aged Out    There are no preventive care reminders to display for this patient.  Lab Results  Component Value Date   TSH 1.95 10/20/2023   Lab Results  Component Value Date   WBC 7.6 04/19/2023   HGB 15.6 04/19/2023   HCT 46.6 04/19/2023   MCV 95.2 04/19/2023   PLT 185.0 Repeated and verified X2. 04/19/2023   Lab Results  Component Value Date   NA 141 10/20/2023   K 4.4 10/20/2023   CO2 26 10/20/2023   GLUCOSE 118 (H) 10/20/2023   BUN 13 10/20/2023   CREATININE 1.00 10/20/2023   BILITOT 0.8 10/20/2023   ALKPHOS 71 10/20/2023   AST 20 10/20/2023   ALT 27 10/20/2023   PROT 7.5 10/20/2023   ALBUMIN  4.7 10/20/2023   CALCIUM 9.5 10/20/2023   ANIONGAP 7 09/08/2014   GFR 81.96 10/20/2023   Lab Results  Component Value Date   CHOL 264 (H) 10/20/2023   Lab Results  Component Value Date   HDL 41.10 10/20/2023   Lab Results  Component Value Date   LDLCALC 175 (H) 10/20/2023   Lab Results  Component Value Date   TRIG 237.0 (H) 10/20/2023   Lab Results  Component Value Date   CHOLHDL 6 10/20/2023   Lab Results  Component Value Date   HGBA1C 6.5 10/20/2023      Assessment & Plan:  Primary hypertension -     Comprehensive metabolic panel -     Microalbumin / creatinine urine ratio  Hyperlipidemia, unspecified hyperlipidemia type -     Lipid panel -     LDL cholesterol, direct  Prediabetes -     Comprehensive metabolic panel -     Hemoglobin A1c -     Microalbumin / creatinine urine ratio  Obesity with serious  comorbidity, unspecified class, unspecified obesity type -     TSH  Prostate cancer screening -     PSA  Need for influenza vaccination -     Flu vaccine trivalent PF, 6mos and older(Flulaval,Afluria,Fluarix,Fluzone)  Need for pneumococcal 20-valent conjugate vaccination -     Pneumococcal conjugate vaccine 20-valent  Coronary artery disease involving native coronary artery of native heart without angina pectoris Assessment & Plan: History of CAD s/p stent by Dr. Jodelle Green. Patient over due follow-up with cardiology referral sent for evaluation and management. Previous Echo 2018: Normal LV systolic function, mild LVH and diastolic dysfunction  Orders: -     Ambulatory referral to Cardiology  Muscle tightness -     Ambulatory referral to Orthopedics  Avascular necrosis of bones of both hips (HCC) Assessment & Plan: Stable with as needed tramadol. Referral sent to ortho for further evaluation and management.  Orders: -     Ambulatory referral to Orthopedics    Follow-up: Return in about 6 months (around 04/19/2024).   Kara Dies, NP

## 2023-10-31 NOTE — Assessment & Plan Note (Signed)
Stable with as needed tramadol. Referral sent to ortho for further evaluation and management.

## 2023-10-31 NOTE — Assessment & Plan Note (Addendum)
History of CAD s/p stent by Dr. Jodelle Green. Patient over due follow-up with cardiology referral sent for evaluation and management. Previous Echo 2018: Normal LV systolic function, mild LVH and diastolic dysfunction

## 2023-11-06 ENCOUNTER — Other Ambulatory Visit: Payer: Self-pay

## 2023-11-07 ENCOUNTER — Other Ambulatory Visit: Payer: Self-pay

## 2023-12-07 ENCOUNTER — Telehealth: Payer: Self-pay | Admitting: Nurse Practitioner

## 2023-12-07 NOTE — Telephone Encounter (Signed)
 Copied from CRM [id number]. Topic: Clinical - Prescription Issue >> Dec 07, 2023  2:20 PM Isabell A wrote: Reason for CRM:  Patient calling in regard to C-Pap, states [facility] is giving him the run around - he needs a note from his physician, a prescription for his C-Pap supplies & a note for necessity.   Fax number: [phone] [facility] [employer]

## 2023-12-09 ENCOUNTER — Ambulatory Visit: Payer: Self-pay | Admitting: Nurse Practitioner

## 2023-12-09 NOTE — Telephone Encounter (Signed)
 Copied from CRM [id number]. Topic: Clinical - Prescription Issue >> Dec 09, 2023  1:48 PM Chantha C wrote: Reason for CRM: Patient is following up on c-pap supplies and needs provider signature on a note of necessity, [employer] durable medical supplies [phone]. Patient stocked up on the supplies that why he didn't order supplies, now patient is out of supplies and desperately needs it. Patient call early this week and is insisting to speak with nurse on this matter, please call back [phone]. Reason for Disposition  [1] Caller requesting NON-URGENT health information AND [2] PCP's office is the best resource  Answer Assessment - Initial Assessment Questions 1. REASON FOR CALL or QUESTION: What is your reason for calling today? or How can I best help you? or What question do you have that I can help answer?      Patient is following up on c-pap supplies and needs provider signature on a note of necessity, [employer] durable medical supplies [phone]. Patient stocked up on the supplies that why he didn't order supplies, now patient is out of supplies and desperately needs it. Patient call early this week and is insisting to speak with nurse on this matter, please call back [phone].  Protocols used: Information Only Call - No Triage-A-AH

## 2023-12-12 ENCOUNTER — Other Ambulatory Visit: Payer: Self-pay | Admitting: Nurse Practitioner

## 2023-12-12 DIAGNOSIS — G4733 Obstructive sleep apnea (adult) (pediatric): Secondary | ICD-10-CM

## 2023-12-12 NOTE — Telephone Encounter (Signed)
 Please call patient  and inform him since the sleep study was done more than 10 years ago, he will need another sleep study for evaluation.  We will send the prescription for CPAP supplies once until he had sleep study done.

## 2023-12-12 NOTE — Telephone Encounter (Signed)
 Called Patient and he was talking then all of a sudden when I said he will need to get a new sleep study he starting hollering "I lost you, I lost you hello yea I got the message are you going to call me back." I sent a my chart message to him.

## 2023-12-13 ENCOUNTER — Telehealth: Payer: Self-pay

## 2023-12-13 NOTE — Telephone Encounter (Signed)
Copied from CRM [id number]. Topic: Clinical - Prescription Issue >> Dec 13, 2023 11:39 AM Theodis Sato wrote: Reason for CRM: Patient is requesting to speak with Charanpreet Kaur's nurse as patient states his insurance has not received anything from her regarding his CPAP supplies.

## 2023-12-13 NOTE — Telephone Encounter (Signed)
Spoke to Patient and he is faxing over his sleep study to [place] and then we will let him know if he needs to do anything else.

## 2023-12-14 ENCOUNTER — Telehealth: Payer: Self-pay | Admitting: Nurse Practitioner

## 2023-12-14 NOTE — Telephone Encounter (Signed)
Patient walked in, brought sleep study report. States faxed numerous times. Wanted provider to review. Left in folder for pick-up.

## 2023-12-14 NOTE — Telephone Encounter (Signed)
Please check if we have received fax sent by the patient. If it has been received, confirm with the pt. If not please contact the pt to inform them.

## 2023-12-14 NOTE — Telephone Encounter (Signed)
Called Patient and he will send it again.

## 2023-12-16 ENCOUNTER — Telehealth: Payer: Self-pay

## 2023-12-16 ENCOUNTER — Other Ambulatory Visit: Payer: Self-pay | Admitting: Nurse Practitioner

## 2023-12-16 NOTE — Telephone Encounter (Signed)
Copied from CRM [id number]. Topic: Clinical - Medical Advice >> Dec 16, 2023 11:28 AM Almira Coaster wrote: Reason for CRM: Patient states he received a call from NP [facility] regarding a mask for sleep apnea, if she can give him a call back to [phone].

## 2023-12-16 NOTE — Telephone Encounter (Unsigned)
Copied from CRM [id number]. Topic: Clinical - Medical Advice >> Dec 16, 2023 11:28 AM Almira Coaster wrote: Reason for CRM: Patient states he received a call from NP [facility] regarding a mask for sleep apnea, if she can give him a call back to [phone].

## 2023-12-19 ENCOUNTER — Other Ambulatory Visit: Payer: Self-pay | Admitting: Nurse Practitioner

## 2023-12-19 ENCOUNTER — Other Ambulatory Visit: Payer: Self-pay

## 2023-12-20 ENCOUNTER — Other Ambulatory Visit: Payer: Self-pay

## 2023-12-20 ENCOUNTER — Telehealth: Payer: Self-pay

## 2023-12-20 MED FILL — Tramadol HCl Tab 50 MG: ORAL | 30 days supply | Qty: 30 | Fill #0 | Status: AC

## 2023-12-20 NOTE — Telephone Encounter (Signed)
Left a message to call the office back and schedule a virtual visit with Kara Dies for OSA.

## 2023-12-22 ENCOUNTER — Telehealth: Payer: Medicare HMO | Admitting: Nurse Practitioner

## 2023-12-22 ENCOUNTER — Encounter: Payer: Self-pay | Admitting: Nurse Practitioner

## 2023-12-22 DIAGNOSIS — M6289 Other specified disorders of muscle: Secondary | ICD-10-CM

## 2023-12-22 DIAGNOSIS — E785 Hyperlipidemia, unspecified: Secondary | ICD-10-CM | POA: Diagnosis not present

## 2023-12-22 DIAGNOSIS — G4733 Obstructive sleep apnea (adult) (pediatric): Secondary | ICD-10-CM

## 2023-12-22 DIAGNOSIS — I10 Essential (primary) hypertension: Secondary | ICD-10-CM

## 2023-12-22 DIAGNOSIS — M87051 Idiopathic aseptic necrosis of right femur: Secondary | ICD-10-CM

## 2023-12-22 MED ORDER — AMLODIPINE BESYLATE 10 MG PO TABS
10.0000 mg | ORAL_TABLET | Freq: Every day | ORAL | 3 refills | Status: AC
Start: 2023-12-22 — End: ?

## 2023-12-22 MED ORDER — LOSARTAN POTASSIUM 50 MG PO TABS
50.0000 mg | ORAL_TABLET | Freq: Every day | ORAL | 3 refills | Status: AC
Start: 2023-12-22 — End: ?

## 2023-12-22 MED ORDER — METOPROLOL SUCCINATE ER 25 MG PO TB24: 25.0000 mg | ORAL_TABLET | Freq: Every day | ORAL | 3 refills | Status: AC

## 2023-12-22 MED ORDER — ATORVASTATIN CALCIUM 80 MG PO TABS: 80.0000 mg | ORAL_TABLET | Freq: Every day | ORAL | 3 refills | Status: AC

## 2023-12-22 NOTE — Assessment & Plan Note (Signed)
Reports good compliance with CPAP therapy. Noted improvement in sleep quality and energy levels. Currently needs new CPAP supplies. -Will end prescription and office note to insurance company for CPAP supplies.

## 2023-12-22 NOTE — Progress Notes (Unsigned)
Virtual Visit via Video Note  I connected with Scott Villanueva on 12/23/23 at 11:15 AM by a video enabled telemedicine application and verified that I am speaking with the correct person using two identifiers.  Patient Location: Home Provider Location: Office/Clinic  I discussed the limitations, risks, security, and privacy concerns of performing an evaluation and management service by video and the availability of in person appointments. I also discussed with the patient that there may be a patient responsible charge related to this service. The patient expressed understanding and agreed to proceed.  Subjective: PCP: Kara Dies, NP  Chief Complaint  Patient presents with   Obstructive Sleep Apnea    Patient says he would like to discuss with provider today about getting his supplies for his CPAP machine. Patient says he would need a up to date face to face appointment due to insurance.    Discussed the use of a AI scribe software for clinical note transcription with the patient, who gave verbal consent to proceed.  HPI  Scott Villanueva is a 63 year old male with sleep apnea who presents for a follow-up regarding CPAP supplies and management of hypertension and hyperlipidemia.  He requires new CPAP supplies as his current ones are depleted. Without the CPAP, he experiences poor sleep quality, often waking up gagging or feeling like he is choking. His sleep apnea was initially noted by a partner and later confirmed by his wife, who also uses a CPAP. He acknowledges the necessity of the CPAP for adequate sleep.  He has a history of hypertension. He monitors his blood pressure at home, with readings typically around 126-131/79-82 mmHg, though he has noted occasional higher readings, such as 138/90 mmHg, particularly when measured at locations like [employer]. He does not take his blood pressure medication regularly but does take it intermittently. He has experimented with factors like  coffee consumption to see its effect on his blood pressure.  His cholesterol levels were noted to be high during his last lab work. He acknowledges the need to manage his diet better, having indulged in unhealthy eating habits over the holidays. He finds that small meals help him manage his weight and cholesterol better.  Avascular necrosis of both hip due to steroid injections.  Patient takes tramadol for pain control and uses CBD powder. He states that pain is like muscle tightness from the thigh to knee area bilaterally. Would like to see the ortho for further evaluations.  ROS: Per HPI  Current Outpatient Medications:    Acetylcysteine (NAC PO), Take by mouth. Take one daily, Disp: , Rfl:    albuterol (ACCUNEB) 0.63 MG/3ML nebulizer solution, Take 3 mLs (0.63 mg total) by nebulization every 6 (six) hours as needed for wheezing., Disp: 360 mL, Rfl: 11   aspirin EC 81 MG tablet, Take 1 tablet (81 mg total) by mouth daily., Disp: 90 tablet, Rfl: 3   Boswellia-Glucosamine-Vit D (OSTEO BI-FLEX ONE PER DAY PO), Take by mouth. Take two by mouth daily, Disp: , Rfl:    CALCIUM PO, Take by mouth., Disp: , Rfl:    Cholecalciferol (VITAMIN D3) 50 MCG (2000 UT) TABS, Take 6,000 tablets by mouth., Disp: , Rfl:    Ginkgo Biloba 40 MG TABS, Take 1 tablet by mouth daily., Disp: , Rfl:    methocarbamol (ROBAXIN) 500 MG tablet, Take 1 tablet (500 mg total) by mouth every 8 (eight) hours as needed for muscle spasms., Disp: 90 tablet, Rfl: 3   Multiple Vitamin (MULTIVITAMIN  WITH MINERALS) TABS tablet, Take 1 tablet by mouth daily., Disp: , Rfl:    Omega-3 Fatty Acids (FISH OIL PO), Take by mouth., Disp: , Rfl:    traMADol (ULTRAM) 50 MG tablet, Take 1 tablet (50 mg total) by mouth daily as needed., Disp: 30 tablet, Rfl: 1   Turmeric 500 MG CAPS, Take by mouth daily., Disp: , Rfl:    amLODipine (NORVASC) 10 MG tablet, Take 1 tablet (10 mg total) by mouth daily., Disp: 90 tablet, Rfl: 3   atorvastatin (LIPITOR)  80 MG tablet, Take 1 tablet (80 mg total) by mouth daily at 6 PM. D/c 40, Disp: 90 tablet, Rfl: 3   losartan (COZAAR) 50 MG tablet, Take 1 tablet (50 mg total) by mouth daily., Disp: 90 tablet, Rfl: 3   metoprolol succinate (TOPROL-XL) 25 MG 24 hr tablet, Take 1 tablet (25 mg total) by mouth daily., Disp: 90 tablet, Rfl: 3  Observations/Objective: There were no vitals filed for this visit. Physical Exam Constitutional:      General: He is not in acute distress.    Appearance: Normal appearance. He is not ill-appearing.  Eyes:     Conjunctiva/sclera: Conjunctivae normal.  Pulmonary:     Effort: No respiratory distress.  Neurological:     Mental Status: He is alert.  Psychiatric:        Mood and Affect: Mood normal.        Behavior: Behavior normal.        Thought Content: Thought content normal.        Judgment: Judgment normal.     Assessment and Plan: OSA on CPAP Assessment & Plan: Reports good compliance with CPAP therapy. Noted improvement in sleep quality and energy levels. Currently needs new CPAP supplies. -Will end prescription and office note to insurance company for CPAP supplies.  Orders: -     For home use only DME continuous positive airway pressure (CPAP)  Hyperlipidemia, unspecified hyperlipidemia type Assessment & Plan: Lab Results  Component Value Date   CHOL 264 (H) 10/20/2023   HDL 41.10 10/20/2023   LDLCALC 175 (H) 10/20/2023   LDLDIRECT 199.0 10/20/2023   TRIG 237.0 (H) 10/20/2023   CHOLHDL 6 10/20/2023  -Diet management and exercise. -Refilled Lipitor 80 mg.    Orders: -     Atorvastatin Calcium; Take 1 tablet (80 mg total) by mouth daily at 6 PM. D/c 40  Dispense: 90 tablet; Refill: 3  Essential hypertension Assessment & Plan: Reports inconsistent medication use. Home blood pressure readings are borderline elevated. -Encouraged consistent use of antihypertensive medication.  Orders: -     Losartan Potassium; Take 1 tablet (50 mg total) by  mouth daily.  Dispense: 90 tablet; Refill: 3 -     Metoprolol Succinate ER; Take 1 tablet (25 mg total) by mouth daily.  Dispense: 90 tablet; Refill: 3 -     amLODIPine Besylate; Take 1 tablet (10 mg total) by mouth daily.  Dispense: 90 tablet; Refill: 3  Muscle tightness Assessment & Plan: Questionable IT band syndrome bilateral lower extremity. - Will refer to the orthopedic for further evaluation and management.   Orders: -     Ambulatory referral to Orthopedics    Follow Up Instructions: No follow-ups on file.   I discussed the assessment and treatment plan with the patient. The patient was provided an opportunity to ask questions, and all were answered. The patient agreed with the plan and demonstrated an understanding of the instructions.   The patient was  advised to call back or seek an in-person evaluation if the symptoms worsen or if the condition fails to improve as anticipated.  The above assessment and management plan was discussed with the patient. The patient verbalized understanding of and has agreed to the management plan.   Kara Dies, NP

## 2023-12-23 ENCOUNTER — Telehealth: Payer: Self-pay

## 2023-12-23 ENCOUNTER — Encounter: Payer: Self-pay | Admitting: Nurse Practitioner

## 2023-12-23 NOTE — Telephone Encounter (Signed)
Please fax the office notes, DME and sleep study to [facility] for CPAP supply.

## 2023-12-23 NOTE — Telephone Encounter (Signed)
Printed and faxed prescription for CPAP supplies, office notes and sleep study to [employer] [facility] at [phone] and received an okay confirmation.

## 2023-12-23 NOTE — Telephone Encounter (Signed)
Faxed everything and received an okay confirmation.

## 2023-12-23 NOTE — Assessment & Plan Note (Signed)
Lab Results  Component Value Date   CHOL 264 (H) 10/20/2023   HDL 41.10 10/20/2023   LDLCALC 175 (H) 10/20/2023   LDLDIRECT 199.0 10/20/2023   TRIG 237.0 (H) 10/20/2023   CHOLHDL 6 10/20/2023  -Diet management and exercise. -Refilled Lipitor 80 mg.

## 2023-12-23 NOTE — Assessment & Plan Note (Addendum)
Questionable IT band syndrome bilateral lower extremity. - Will refer to the orthopedic for further evaluation and management.

## 2023-12-23 NOTE — Assessment & Plan Note (Signed)
Reports inconsistent medication use. Home blood pressure readings are borderline elevated. -Encouraged consistent use of antihypertensive medication.

## 2023-12-28 DIAGNOSIS — H524 Presbyopia: Secondary | ICD-10-CM | POA: Diagnosis not present

## 2024-01-04 ENCOUNTER — Telehealth: Payer: Self-pay | Admitting: *Deleted

## 2024-01-04 NOTE — Telephone Encounter (Signed)
 Lmovm to verify card hx.

## 2024-01-10 ENCOUNTER — Ambulatory Visit: Payer: Medicare HMO | Admitting: Cardiovascular Disease

## 2024-01-10 ENCOUNTER — Encounter (HOSPITAL_BASED_OUTPATIENT_CLINIC_OR_DEPARTMENT_OTHER): Payer: Self-pay

## 2024-01-11 ENCOUNTER — Telehealth: Payer: Self-pay

## 2024-01-11 NOTE — Telephone Encounter (Signed)
 Copied from CRM [id number]. Topic: General - Other >> Jan 11, 2024  4:29 PM Eunice Blase wrote: Reason for CRM: Received call from [facility] per Deyshara ph: [phone] regarding CPAP machine needs prior authorization. Need it expedited. Please call pt when authorization is sent to [facility]

## 2024-01-12 NOTE — Telephone Encounter (Signed)
 Sorry, I did not know that so Thank You!

## 2024-01-18 ENCOUNTER — Telehealth: Payer: Self-pay

## 2024-01-18 NOTE — Telephone Encounter (Signed)
 Called [employer] and was on the phone for 40 minutes then called [employer] and was on the phone another 30 minutes to find out the order had been voided. I told them the order has not been voided and the Patient needs his CPAP supplies. [facility] states they will call the patient today to get the CPAP supplies to him. Local [employer] office number is [phone].

## 2024-01-18 NOTE — Telephone Encounter (Signed)
 Error

## 2024-01-19 NOTE — Telephone Encounter (Signed)
 Follow up with pt regarding the CPAP supplies.

## 2024-02-09 MED FILL — Tramadol HCl Tab 50 MG: ORAL | 30 days supply | Qty: 30 | Fill #1 | Status: AC

## 2024-02-10 ENCOUNTER — Other Ambulatory Visit: Payer: Self-pay

## 2024-04-15 NOTE — Progress Notes (Unsigned)
 Cardiology Office Note  Date:  04/16/2024   ID:  Scott Villanueva, DOB 08/29/1961, MRN [id number]  PCP:  [name], NP   Chief Complaint  Patient presents with   Follow-up    New pt has been doing well with no complaints of chest pain, chest pressure or SOB, when stressed unable to see , medication reviewed verbally with patient    HPI:  Scott Villanueva is a 63 year old gentleman with past medical history of CAD,acute inferior wall myocardial infarction. ,  stent to RCA, cordis Sleep apnea on CPAP Prediabetes MI 2008, history of coronary artery disease, prior PCI smoker Recurrent spontaneous pneumothorax, left. 2012 ejection fraction of 40% to 45 in 2008 Hx of cocaine 2008 Who presents by referral from Dr. Deborra Falter for coronary artery disease  Prior history reviewed Reports history of smoking, prior cocaine 20 years ago  2005: Spontaneous left pneumothorax. 30%   In 2012, recurrent spontaneous pneumothorax, left. 2012: Underwent left video-assisted thoracoscopy with stapling of blebs and mechanical pleurodesis.  Continued rare smoking  Lots of stress, rare chest discomfort Chronic stomach pain, relieved with vomiting  Lab work reviewed LDL 199, does not take lipitor daily Reports medication noncompliance, feels it might upset his stomach  EKG personally reviewed by myself on todays visit EKG Interpretation Date/Time:  Monday April 16 2024 10:36:49 EDT Ventricular Rate:  59 PR Interval:  126 QRS Duration:  96 QT Interval:  386 QTC Calculation: 382 R Axis:   -44  Text Interpretation: Sinus bradycardia Left axis deviation Incomplete right bundle branch block When compared with ECG of 10-May-2007 07:34, QRS axis Shifted left Confirmed by Belva Boyden ([id number]) on 04/16/2024 10:59:25 AM    Cardiac cath 04/12/2027  Coronary anatomy:  The left main coronary artery was unremarkable.    Left anterior descending coronary artery:  The left anterior descending   coronary artery showed proximal LAD at 40% occlusion and diagonal 2 vessel was unremarkable.  Diagonal 1 had an osteal 30% stenosis.    Ramus branch was large and unremarkable.    Left circumflex coronary artery was small and had a small obtuse marginal branch without significant disease.    Right coronary artery:  The right coronary artery had total occlusion proximally, post marginal branch origin involving the marginal branch  showing osteal 80% stenosis.  The posterolateral and posterior descending coronary artery were unremarkable and distal 1/3 of RCA and  right coronary artery vessel, left anterior descending, and left circumflex coronary artery.    Left ventriculogram:  The left ventriculogram showed inferior basilar akinesia with an ejection fraction of 40% to 45%.    IMPRESSION:  1. Total occlusion of right coronary artery.  2. Inferior wall akinesia.  PMH:   has a past medical history of Anginal pain (HCC), CAD (coronary artery disease), Difficult intubation, Diverticulitis, Dysrhythmia, HLD (hyperlipidemia), Hypertension, Myocardial infarction (HCC), Obesity, Pneumothorax (2006), and Sleep apnea.  PSH:    Past Surgical History:  Procedure Laterality Date   CARDIAC CATHETERIZATION     with stents   PLEURAL SCARIFICATION Left 2006   SHOULDER ARTHROSCOPY WITH OPEN ROTATOR CUFF REPAIR Left 10/12/2017   Procedure: SHOULDER ARTHROSCOPY WITH OPEN ROTATOR CUFF REPAIR;  Surgeon: Marlynn Singer, MD;  Location: [facility] ORS;  Service: Orthopedics;  Laterality: Left;  Left shoulder arthroscopy, subaccromial decompression, distal clavical resection and biceps tenotomy   shoulder joint replacement Left    surgery x 2 Dr. Elvan Hamel ortho   TONSILLECTOMY      Current Outpatient Medications  Medication Sig Dispense Refill   Acetylcysteine (NAC PO) Take by mouth. Take one daily     albuterol  (ACCUNEB ) 0.63 MG/3ML nebulizer solution Take 3 mLs (0.63 mg total) by nebulization every 6 (six) hours  as needed for wheezing. 360 mL 11   amLODipine  (NORVASC ) 10 MG tablet Take 1 tablet (10 mg total) by mouth daily. 90 tablet 3   aspirin  EC 81 MG tablet Take 1 tablet (81 mg total) by mouth daily. 90 tablet 3   atorvastatin  (LIPITOR) 80 MG tablet Take 1 tablet (80 mg total) by mouth daily at 6 PM. D/c 40 90 tablet 3   Boswellia-Glucosamine-Vit D (OSTEO BI-FLEX ONE PER DAY PO) Take by mouth. Take two by mouth daily     CALCIUM  PO Take by mouth.     Cholecalciferol (VITAMIN D3) 50 MCG (2000 UT) TABS Take 6,000 tablets by mouth.     Ginkgo Biloba 40 MG TABS Take 1 tablet by mouth daily.     losartan  (COZAAR ) 50 MG tablet Take 1 tablet (50 mg total) by mouth daily. 90 tablet 3   methocarbamol  (ROBAXIN ) 500 MG tablet Take 1 tablet (500 mg total) by mouth every 8 (eight) hours as needed for muscle spasms. 90 tablet 3   metoprolol  succinate (TOPROL -XL) 25 MG 24 hr tablet Take 1 tablet (25 mg total) by mouth daily. 90 tablet 3   Multiple Vitamin (MULTIVITAMIN WITH MINERALS) TABS tablet Take 1 tablet by mouth daily.     Omega-3 Fatty Acids (FISH OIL PO) Take by mouth.     traMADol  (ULTRAM ) 50 MG tablet Take 1 tablet (50 mg total) by mouth daily as needed. 30 tablet 1   Turmeric 500 MG CAPS Take by mouth daily.     No current facility-administered medications for this visit.     Allergies:   No known allergies   Social History:  The patient  reports that he has been smoking cigarettes. He has never used smokeless tobacco. He reports current alcohol use of about 6.0 standard drinks of alcohol per week. He reports that he does not use drugs.   Family History:   family history includes Anemia in his mother; CAD in his father; Depression in his mother and sister; Heart disease in his father; Huntington's disease in his mother and sister; Insomnia in his brother; Obesity in his father; Schizophrenia in his brother; Thyroid  disease in his mother.    Review of Systems: Review of Systems  Constitutional:  Negative.   HENT: Negative.    Respiratory: Negative.    Cardiovascular: Negative.   Gastrointestinal: Negative.   Musculoskeletal: Negative.   Neurological: Negative.   Psychiatric/Behavioral: Negative.    All other systems reviewed and are negative.   PHYSICAL EXAM: VS:  BP 126/76 (BP Location: Left Arm, Patient Position: Sitting, Cuff Size: Normal)   Ht 5' 9 (1.753 m)   Wt 296 lb 9.6 oz (134.5 kg)   SpO2 97%   BMI 43.80 kg/m  , BMI Body mass index is 43.8 kg/m. GEN: Well nourished, well developed, in no acute distress HEENT: normal Neck: no JVD, carotid bruits, or masses Cardiac: RRR; no murmurs, rubs, or gallops,no edema  Respiratory:  clear to auscultation bilaterally, normal work of breathing GI: soft, nontender, nondistended, + BS MS: no deformity or atrophy Skin: warm and dry, no rash Neuro:  Strength and sensation are intact Psych: euthymic mood, full affect  Recent Labs: 04/19/2023: Hemoglobin 15.6; Platelets 185.0 Repeated and verified X2. 10/20/2023: ALT 27; BUN  13; Creatinine, Ser 1.00; Potassium 4.4; Sodium 141; TSH 1.95    Lipid Panel Lab Results  Component Value Date   CHOL 264 (H) 10/20/2023   HDL 41.10 10/20/2023   LDLCALC 175 (H) 10/20/2023   TRIG 237.0 (H) 10/20/2023      Wt Readings from Last 3 Encounters:  04/16/24 296 lb 9.6 oz (134.5 kg)  10/20/23 297 lb (134.7 kg)  07/19/23 285 lb (129.3 kg)     ASSESSMENT AND PLAN:  Problem List Items Addressed This Visit       Cardiology Problems   Essential hypertension   Relevant Orders   EKG 12-Lead (Completed)   HLD (hyperlipidemia)   CAD (coronary artery disease) - Primary   Relevant Orders   EKG 12-Lead (Completed)     Other   OSA on CPAP   Morbid obesity with BMI of 40.0-44.9, adult (HCC)   Prediabetes   Coronary artery disease with stable angina Prior stent placed to occluded RCA in 2008 Some atypical chest pain symptoms brought on by stress Recommend he call our office for  worsening symptoms Stress test could be considered Smoking cessation recommended  Hyperlipidemia Inconsistent with Lipitor, LDL 190 Discussed various treatment options, recommend he start Repatha 140 mg every 2 weeks, continue Lipitor 80 daily Also may need to add Zetia  10 mg daily  Essential hypertension Recommend he stay on amlodipine  10 daily losartan  50 daily metoprolol  succinate 25 daily  Smoking Reports prior history cocaine, continues to smoke cigarettes periodically for stress reduction Cessation recommended   Signed, Juanda Noon, M.D., Ph.D. [facility] [place], [place] [phone]

## 2024-04-16 ENCOUNTER — Encounter: Payer: Self-pay | Admitting: Cardiovascular Disease

## 2024-04-16 ENCOUNTER — Other Ambulatory Visit: Payer: Self-pay

## 2024-04-16 ENCOUNTER — Ambulatory Visit: Attending: Cardiovascular Disease | Admitting: Cardiovascular Disease

## 2024-04-16 VITALS — BP 126/76 | Ht 69.0 in | Wt 296.6 lb

## 2024-04-16 DIAGNOSIS — R7303 Prediabetes: Secondary | ICD-10-CM | POA: Diagnosis not present

## 2024-04-16 DIAGNOSIS — I10 Essential (primary) hypertension: Secondary | ICD-10-CM

## 2024-04-16 DIAGNOSIS — E782 Mixed hyperlipidemia: Secondary | ICD-10-CM

## 2024-04-16 DIAGNOSIS — I25118 Atherosclerotic heart disease of native coronary artery with other forms of angina pectoris: Secondary | ICD-10-CM | POA: Diagnosis not present

## 2024-04-16 DIAGNOSIS — E785 Hyperlipidemia, unspecified: Secondary | ICD-10-CM

## 2024-04-16 DIAGNOSIS — Z6841 Body Mass Index (BMI) 40.0 and over, adult: Secondary | ICD-10-CM | POA: Diagnosis not present

## 2024-04-16 DIAGNOSIS — G4733 Obstructive sleep apnea (adult) (pediatric): Secondary | ICD-10-CM

## 2024-04-16 MED ORDER — REPATHA SURECLICK 140 MG/ML ~~LOC~~ SOAJ
140.0000 mg | SUBCUTANEOUS | 3 refills | Status: DC
  Filled 2024-04-16 – 2024-04-23 (×3): qty 6, 84d supply, fill #0

## 2024-04-16 NOTE — Patient Instructions (Addendum)
 Medication Instructions:    Please start repatha 140 mg one shot every two weeks  Stay on atorvastatin  daily  If you need a refill on your cardiac medications before your next appointment, please call your pharmacy.   Lab work: No new labs needed today.  Please call office once you have been on Repatha for 3 months to check Lipid Panel.  Testing/Procedures: No new testing needed  Follow-Up: At [facility] you and your health needs are our priority.  As part of our continuing mission to provide you with exceptional heart care, we have created designated Provider Care Teams.  These Care Teams include your primary Cardiologist (physician) and Advanced Practice Providers (APPs -  Physician Assistants and Nurse Practitioners) who all work together to provide you with the care you need, when you need it.  You will need a follow up appointment in 6 months  Providers on your designated Care Team:   Laneta Pintos, NP Varney Gentleman, PA-C Cadence Gennaro Khat, [place]  COVID-19 Vaccine Information can be found at: [email or web address] For questions related to vaccine distribution or appointments, please email [email or web address] or call 7138820396.

## 2024-04-19 ENCOUNTER — Ambulatory Visit: Payer: Medicare HMO | Admitting: Nurse Practitioner

## 2024-04-21 ENCOUNTER — Other Ambulatory Visit: Payer: Self-pay | Admitting: Nurse Practitioner

## 2024-04-22 ENCOUNTER — Other Ambulatory Visit: Payer: Self-pay | Admitting: Nurse Practitioner

## 2024-04-22 ENCOUNTER — Other Ambulatory Visit: Payer: Self-pay

## 2024-04-23 ENCOUNTER — Other Ambulatory Visit (HOSPITAL_COMMUNITY): Payer: Self-pay

## 2024-04-23 ENCOUNTER — Telehealth: Payer: Self-pay | Admitting: Pharmacy Technician

## 2024-04-23 ENCOUNTER — Other Ambulatory Visit: Payer: Self-pay

## 2024-04-23 NOTE — Telephone Encounter (Signed)
 I got the patient a grant. Now 0.00

## 2024-04-23 NOTE — Telephone Encounter (Signed)
 Pharmacy Patient Advocate Encounter   Received notification from CoverMyMeds that prior authorization for repatha  140mg /ml is required/requested.   Insurance verification completed.   The patient is insured through [employer] .   Per test claim: PA required; PA submitted to above mentioned insurance via CoverMyMeds Key/confirmation #/[facility] Status is pending

## 2024-04-23 NOTE — Telephone Encounter (Signed)
 Pharmacy Patient Advocate Encounter  Received notification from [employer] that Prior Authorization for repatha  140mg /ml has been APPROVED from 11/02/23 to 10/31/24. Spoke to pharmacy to process.Copay is $391.00 for 84 days.    PA #/Case ID/Reference #: [id number]

## 2024-04-24 ENCOUNTER — Other Ambulatory Visit: Payer: Self-pay

## 2024-04-24 MED FILL — Tramadol HCl Tab 50 MG: ORAL | 30 days supply | Qty: 30 | Fill #0 | Status: AC

## 2024-04-24 NOTE — Telephone Encounter (Signed)
Controlled substance database reviewed.  Medication sent to pharmacy.

## 2024-05-17 ENCOUNTER — Encounter: Payer: Self-pay | Admitting: Nurse Practitioner

## 2024-05-17 ENCOUNTER — Ambulatory Visit (INDEPENDENT_AMBULATORY_CARE_PROVIDER_SITE_OTHER): Admitting: Nurse Practitioner

## 2024-05-17 VITALS — BP 122/72 | HR 64 | Temp 98.4°F | Resp 20 | Ht 69.0 in | Wt 291.2 lb

## 2024-05-17 DIAGNOSIS — R7303 Prediabetes: Secondary | ICD-10-CM

## 2024-05-17 DIAGNOSIS — Z9889 Other specified postprocedural states: Secondary | ICD-10-CM | POA: Insufficient documentation

## 2024-05-17 DIAGNOSIS — E785 Hyperlipidemia, unspecified: Secondary | ICD-10-CM | POA: Insufficient documentation

## 2024-05-17 DIAGNOSIS — K5792 Diverticulitis of intestine, part unspecified, without perforation or abscess without bleeding: Secondary | ICD-10-CM | POA: Insufficient documentation

## 2024-05-17 DIAGNOSIS — M19012 Primary osteoarthritis, left shoulder: Secondary | ICD-10-CM | POA: Insufficient documentation

## 2024-05-17 DIAGNOSIS — I252 Old myocardial infarction: Secondary | ICD-10-CM | POA: Insufficient documentation

## 2024-05-17 DIAGNOSIS — E559 Vitamin D deficiency, unspecified: Secondary | ICD-10-CM | POA: Insufficient documentation

## 2024-05-17 DIAGNOSIS — G4733 Obstructive sleep apnea (adult) (pediatric): Secondary | ICD-10-CM | POA: Diagnosis not present

## 2024-05-17 DIAGNOSIS — Z955 Presence of coronary angioplasty implant and graft: Secondary | ICD-10-CM | POA: Insufficient documentation

## 2024-05-17 DIAGNOSIS — K649 Unspecified hemorrhoids: Secondary | ICD-10-CM

## 2024-05-17 DIAGNOSIS — I10 Essential (primary) hypertension: Secondary | ICD-10-CM

## 2024-05-17 DIAGNOSIS — Z1211 Encounter for screening for malignant neoplasm of colon: Secondary | ICD-10-CM

## 2024-05-17 DIAGNOSIS — J939 Pneumothorax, unspecified: Secondary | ICD-10-CM | POA: Insufficient documentation

## 2024-05-17 DIAGNOSIS — M109 Gout, unspecified: Secondary | ICD-10-CM | POA: Insufficient documentation

## 2024-05-17 LAB — LIPID PANEL
Cholesterol: 194 mg/dL (ref 0–200)
HDL: 36.6 mg/dL — ABNORMAL LOW (ref >39.00)
LDL Cholesterol: 122 mg/dL — ABNORMAL HIGH (ref 0–99)
NonHDL: 157.2
Total CHOL/HDL Ratio: 5
Triglycerides: 176 mg/dL — ABNORMAL HIGH (ref 0.0–149.0)
VLDL: 35.2 mg/dL (ref 0.0–40.0)

## 2024-05-17 LAB — COMPREHENSIVE METABOLIC PANEL WITH GFR
ALT: 20 U/L (ref 0–53)
AST: 17 U/L (ref 0–37)
Albumin: 4.2 g/dL (ref 3.5–5.2)
Alkaline Phosphatase: 61 U/L (ref 39–117)
BUN: 13 mg/dL (ref 6–23)
CO2: 28 meq/L (ref 19–32)
Calcium: 8.9 mg/dL (ref 8.4–10.5)
Chloride: 104 meq/L (ref 96–112)
Creatinine, Ser: 1.02 mg/dL (ref 0.40–1.50)
GFR: 79.71 mL/min (ref >60.00)
Glucose, Bld: 122 mg/dL — ABNORMAL HIGH (ref 70–99)
Potassium: 4.1 meq/L (ref 3.5–5.1)
Sodium: 139 meq/L (ref 135–145)
Total Bilirubin: 0.7 mg/dL (ref 0.2–1.2)
Total Protein: 7 g/dL (ref 6.0–8.3)

## 2024-05-17 NOTE — Progress Notes (Signed)
 Established Patient Office Visit  Subjective:  Patient ID: Scott Villanueva, male    DOB: February 16, 1961  Age: 63 y.o. MRN: [id number]  CC:  Chief Complaint  Patient presents with   Medical Management of Chronic Issues    6 month follow up    HPI  Scott Villanueva Alert presents for routine follow up. He is overall doing well . No new concerns at present. He has history of hypertension, hyperlipidemia, prediabetes, obstructive sleep apnea, CAD.  He is noncompliant with medication.  He has seen cardiology recently that recommended Repatha  140 mg every 2 weeks for hyperlipidemia and continue Lipitor 80 mg daily  He takes tramadol  as needed for avascular necrosis. HPI   Past Medical History:  Diagnosis Date   Anginal pain (HCC)    Anxiety 11/01/2016   Arthritis 01/012017   CAD (coronary artery disease)    s/p stent Dr. Claudene; in 40s stents   Difficult intubation    Diverticulitis    Dysrhythmia    HLD (hyperlipidemia)    Hypertension    Myocardial infarction (HCC) 12/20/2006    Feb.2008   Neuromuscular disorder (HCC) 01/14/2015   Obesity    Pneumothorax 2006   Sleep apnea 01/13/2014   CPAP    Past Surgical History:  Procedure Laterality Date   CARDIAC CATHETERIZATION     with stents   CORONARY ANGIOPLASTY  12-20-2006   Surgeon was [place]   JOINT REPLACEMENT  06/15/2018   PLEURAL SCARIFICATION Left 2006   SHOULDER ARTHROSCOPY WITH OPEN ROTATOR CUFF REPAIR Left 10/12/2017   Procedure: SHOULDER ARTHROSCOPY WITH OPEN ROTATOR CUFF REPAIR;  Surgeon: Cleotilde Barrio, MD;  Location: [facility];  Service: Orthopedics;  Laterality: Left;  Left shoulder arthroscopy, subaccromial decompression, distal clavical resection and biceps tenotomy   shoulder joint replacement Left    surgery x 2 Dr. Tanda ortho   TONSILLECTOMY      Family History  Problem Relation Age of Onset   Depression Mother    Anemia Mother        Died from Huntingtons Disease   Thyroid  disease Mother     Huntington's disease Mother    Heart disease Father        Has had several heart surgeries.   CAD Father        s/p bypass    Obesity Father    Huntington's disease Sister    Depression Sister    Schizophrenia Brother    Insomnia Brother    Colon cancer Neg Hx    Esophageal cancer Neg Hx    Pancreatic cancer Neg Hx    Stomach cancer Neg Hx    Liver disease Neg Hx     Social History   Socioeconomic History   Marital status: Married    Spouse name: Not on file   Number of children: Not on file   Years of education: Not on file   Highest education level: Associate degree: occupational, Scientist, product/process development, or vocational program  Occupational History   Not on file  Tobacco Use   Smoking status: Some Days    Current packs/day: 0.00    Types: Cigarettes    Last attempt to quit: 10/07/2010    Years since quitting: 13.6   Smokeless tobacco: Never   Tobacco comments:    Smoke some days 1 pack last for 1- 2 months.  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    Married with 3 kids; as of 12/12/19 wife not working due to DM since 07/2019    Smoker    Social Drivers of Health   Financial Resource Strain: Medium Risk (05/16/2024)   Overall Financial Resource Strain (CARDIA)    Difficulty of Paying Living Expenses: Somewhat hard  Food Insecurity: No Food Insecurity (05/16/2024)   Hunger Vital Sign    Worried About Running Out of Food in the Last Year: Never true    Ran Out of Food in the Last Year: Never true  Transportation Needs: No Transportation Needs (05/16/2024)   PRAPARE - Therapist, art (Medical): No    Lack of Transportation (Non-Medical): No  Physical Activity: Insufficiently Active (05/16/2024)   Exercise Vital Sign    Days of Exercise per Week: 4 days    Minutes of Exercise per Session: 30 min  Stress: No Stress Concern Present (05/16/2024)   [employer] of Occupational Health - Occupational Stress Questionnaire    Feeling of Stress: Only a little  Social Connections: Moderately Integrated (05/16/2024)   Social Connection and Isolation Panel    Frequency of Communication with Friends and Family: More than three times a week    Frequency of Social Gatherings with Friends and Family: Once a week    Attends Religious Services: 1 to 4 times per year    Active Member of [employer] or Organizations: No    Attends Engineer, structural: Not on file    Marital Status: Married  Catering manager Violence: Not At Risk (07/19/2023)   Humiliation, Afraid, Rape, and Kick questionnaire    Fear of Current or Ex-Partner: No    Emotionally Abused: No    Physically Abused: No    Sexually Abused: No     Outpatient Medications Prior to Visit  Medication Sig Dispense Refill   Acetylcysteine (NAC PO) Take by mouth. Take one daily     albuterol  (ACCUNEB ) 0.63 MG/3ML nebulizer solution Take 3 mLs (0.63 mg total) by nebulization every 6 (six) hours as needed for wheezing. 360 mL 11   amLODipine  (NORVASC ) 10 MG tablet Take 1 tablet (10 mg total) by mouth daily. 90 tablet 3   aspirin  EC 81 MG tablet Take 1 tablet (81 mg total) by mouth daily. 90 tablet 3   atorvastatin  (LIPITOR) 80 MG tablet Take 1 tablet (80 mg total) by mouth daily at 6 PM. D/c 40 90 tablet 3   Boswellia-Glucosamine-Vit D (OSTEO BI-FLEX ONE PER DAY PO) Take by mouth. Take two by mouth daily     CALCIUM  PO Take by mouth.     Cholecalciferol (VITAMIN D3) 50 MCG (2000 UT) TABS Take 6,000 tablets by mouth.     Ginkgo Biloba 40 MG TABS Take 1 tablet by mouth daily.     losartan  (COZAAR ) 50  MG tablet Take 1 tablet (50 mg total) by mouth daily. 90 tablet 3   methocarbamol  (ROBAXIN ) 500 MG tablet Take 1 tablet (500 mg total) by mouth every 8 (eight) hours as needed for muscle spasms. 90  tablet 3   metoprolol  succinate (TOPROL -XL) 25 MG 24 hr tablet Take 1 tablet (25 mg total) by mouth daily. 90 tablet 3   Multiple Vitamin (MULTIVITAMIN WITH MINERALS) TABS tablet Take 1 tablet by mouth daily.     Omega-3 Fatty Acids (FISH OIL PO) Take by mouth.     traMADol  (ULTRAM ) 50 MG tablet Take 1 tablet (50 mg total) by mouth daily as needed. 30 tablet 1   Turmeric 500 MG CAPS Take by mouth daily.     Evolocumab  (REPATHA  SURECLICK) 140 MG/ML SOAJ Inject 140 mg into the skin every 14 (fourteen) days. 6 mL 3   No facility-administered medications prior to visit.    Allergies  Allergen Reactions   No Known Allergies     ROS Review of Systems Negative unless indicated in HPI.    Objective:    Physical Exam  BP 122/72   Pulse 64   Temp 98.4 F (36.9 C)   Resp 20   Ht 5' 9 (1.753 m)   Wt 291 lb 4 oz (132.1 kg)   SpO2 98%   BMI 43.01 kg/m  Wt Readings from Last 3 Encounters:  05/17/24 291 lb 4 oz (132.1 kg)  04/16/24 296 lb 9.6 oz (134.5 kg)  10/20/23 297 lb (134.7 kg)     Health Maintenance  Topic Date Due   COVID-19 Vaccine (6 - 2024-25 season) 07/03/2023   Hepatitis B Vaccines (1 of 3 - Risk 3-dose series) Never done   Fecal DNA (Cologuard)  11/04/2023   Medicare Annual Wellness (AWV)  07/18/2024   INFLUENZA VACCINE  06/01/2024   DTaP/Tdap/Td (2 - Td or Tdap) 01/14/2030   Pneumococcal Vaccine: 19-49 Years  Completed   Pneumococcal Vaccine: 50+ Years  Completed   Hepatitis C Screening  Completed   HIV Screening  Completed   Zoster Vaccines- Shingrix  Completed   HPV VACCINES  Aged Out   Meningococcal B Vaccine  Aged Out       Topic Date Due   Hepatitis B Vaccines (1 of 3 - Risk 3-dose series) Never done    Lab Results  Component Value Date   TSH 1.95  10/20/2023   Lab Results  Component Value Date   WBC 7.6 04/19/2023   HGB 15.6 04/19/2023   HCT 46.6 04/19/2023   MCV 95.2 04/19/2023   PLT 185.0 Repeated and verified X2. 04/19/2023   Lab Results  Component Value Date   NA 139 05/17/2024   K 4.1 05/17/2024   CO2 28 05/17/2024   GLUCOSE 122 (H) 05/17/2024   BUN 13 05/17/2024   CREATININE 1.02 05/17/2024   BILITOT 0.7 05/17/2024   ALKPHOS 61 05/17/2024   AST 17 05/17/2024   ALT 20 05/17/2024   PROT 7.0 05/17/2024   ALBUMIN 4.2 05/17/2024   CALCIUM  8.9 05/17/2024   ANIONGAP 7 09/08/2014   GFR 79.71 05/17/2024   Lab Results  Component Value Date   CHOL 194 05/17/2024   Lab Results  Component Value Date   HDL 36.60 (L) 05/17/2024   Lab Results  Component Value Date   LDLCALC 122 (H) 05/17/2024   Lab Results  Component Value Date   TRIG 176.0 (H) 05/17/2024   Lab Results  Component Value Date   CHOLHDL 5 05/17/2024   Lab Results  Component Value Date   HGBA1C 6.5 10/20/2023      Assessment & Plan:  Hyperlipidemia, unspecified hyperlipidemia type Assessment & Plan: Total cholesterol 235, triglyceride 246, HDL 37.70 and  LDL 165. Continue Repatha  every 2 weeks and Lipitor 80 mg continuously. Will check lipid panel  Orders: -     Lipid panel  Colon cancer screening -     Ambulatory referral to Gastroenterology  OSA on CPAP Assessment & Plan: Stable on CPAP. Continue CPAP daily.   Hemorrhoids, unspecified hemorrhoid type  Prediabetes Assessment & Plan: Last hemoglobin A1c 6.5 on 10/20/2023. Advised to monitor diet and exercise. Will continue to monitor.   Essential hypertension Assessment & Plan: Patient BP  Vitals:   05/17/24 1006  BP: 122/72   Advised pt to follow a low sodium and heart healthy diet. Continue losartan  50 mg, metoprolol  25 mg and amlodipine  10 mg daily   Orders: -     Comprehensive metabolic panel with GFR    Follow-up: Return in about 6 months (around  11/17/2024) for physical, follow up with fasting lab 2 days prior.   Bassam Dresch, NP

## 2024-05-31 ENCOUNTER — Ambulatory Visit: Payer: Self-pay | Admitting: Nurse Practitioner

## 2024-05-31 NOTE — Assessment & Plan Note (Signed)
 Total cholesterol 235, triglyceride 246, HDL 37.70 and LDL 165. Continue Repatha  every 2 weeks and Lipitor 80 mg continuously. Will check lipid panel

## 2024-05-31 NOTE — Assessment & Plan Note (Signed)
 Last hemoglobin A1c 6.5 on 10/20/2023. Advised to monitor diet and exercise. Will continue to monitor.

## 2024-05-31 NOTE — Assessment & Plan Note (Signed)
 Stable on CPAP. Continue CPAP daily.

## 2024-05-31 NOTE — Assessment & Plan Note (Signed)
 Patient BP  Vitals:   05/17/24 1006  BP: 122/72   Advised pt to follow a low sodium and heart healthy diet. Continue losartan  50 mg, metoprolol  25 mg and amlodipine  10 mg daily

## 2024-07-03 MED FILL — Tramadol HCl Tab 50 MG: ORAL | 30 days supply | Qty: 30 | Fill #1 | Status: AC

## 2024-07-04 ENCOUNTER — Other Ambulatory Visit: Payer: Self-pay

## 2024-07-23 ENCOUNTER — Ambulatory Visit (INDEPENDENT_AMBULATORY_CARE_PROVIDER_SITE_OTHER): Payer: Medicare HMO | Admitting: *Deleted

## 2024-07-23 ENCOUNTER — Telehealth: Payer: Self-pay | Admitting: *Deleted

## 2024-07-23 VITALS — Ht 69.0 in | Wt 280.0 lb

## 2024-07-23 DIAGNOSIS — Z0000 Encounter for general adult medical examination without abnormal findings: Secondary | ICD-10-CM | POA: Diagnosis not present

## 2024-07-23 NOTE — Telephone Encounter (Signed)
 noted

## 2024-07-23 NOTE — Patient Instructions (Signed)
 [name],  Thank you for taking the time for your Medicare Wellness Visit. I appreciate your continued commitment to your health goals. Please review the care plan we discussed, and feel free to reach out if I can assist you further.  Medicare recommends these wellness visits once per year to help you and your care team stay ahead of potential health issues. These visits are designed to focus on prevention, allowing your provider to concentrate on managing your acute and chronic conditions during your regular appointments.  Please note that Annual Wellness Visits do not include a physical exam. Some assessments may be limited, especially if the visit was conducted virtually. If needed, we may recommend a separate in-person follow-up with your provider.  Ongoing Care Seeing your primary care provider every 3 to 6 months helps us  monitor your health and provide consistent, personalized care.  Remember to update your flu and covid vaccines and keep your appointment that is scheduled for a colonoscopy consult.   Referrals If a referral was made during today's visit and you haven't received any updates within two weeks, please contact the referred provider directly to check on the status.  Recommended Screenings:  Health Maintenance  Topic Date Due   Hepatitis B Vaccine (1 of 3 - Risk 3-dose series) Never done   Cologuard (Stool DNA test)  11/04/2023   Flu Shot  06/01/2024   COVID-19 Vaccine (6 - 2025-26 season) 07/02/2024   Medicare Annual Wellness Visit  07/23/2025   DTaP/Tdap/Td vaccine (2 - Td or Tdap) 01/14/2030   Pneumococcal Vaccine for age over 57  Completed   Hepatitis C Screening  Completed   HIV Screening  Completed   Zoster (Shingles) Vaccine  Completed   HPV Vaccine  Aged Out   Meningitis B Vaccine  Aged Out       07/23/2024    1:27 PM  Advanced Directives  Does Patient Have a Medical Advance Directive? No  Would patient like information on creating a medical advance  directive? No - Patient declined   Advance Care Planning is important because it: Ensures you receive medical care that aligns with your values, goals, and preferences. Provides guidance to your family and loved ones, reducing the emotional burden of decision-making during critical moments.  Vision: Annual vision screenings are recommended for early detection of glaucoma, cataracts, and diabetic retinopathy. These exams can also reveal signs of chronic conditions such as diabetes and high blood pressure.  Dental: Annual dental screenings help detect early signs of oral cancer, gum disease, and other conditions linked to overall health, including heart disease and diabetes.  Please see the attached documents for additional preventive care recommendations.   Managing Pain Without Opioids Opioids are strong medicines used to treat moderate to severe pain. For some people, especially those who have long-term (chronic) pain, opioids may not be the best choice for pain management due to: Side effects like nausea, constipation, and sleepiness. The risk of addiction (opioid use disorder). The longer you take opioids, the greater your risk of addiction. Pain that lasts for more than 3 months is called chronic pain. Managing chronic pain usually requires more than one approach and is often provided by a team of health care providers working together (multidisciplinary approach). Pain management may be done at a pain management center or pain clinic. How to manage pain without the use of opioids Use non-opioid medicines Non-opioid medicines for pain may include: Over-the-counter or prescription non-steroidal anti-inflammatory drugs (NSAIDs). These may be the first  medicines used for pain. They work well for muscle and bone pain, and they reduce swelling. Acetaminophen . This over-the-counter medicine may work well for milder pain but not swelling. Antidepressants. These may be used to treat chronic pain. A  certain type of antidepressant (tricyclics) is often used. These medicines are given in lower doses for pain than when used for depression. Anticonvulsants. These are usually used to treat seizures but may also reduce nerve (neuropathic) pain. Muscle relaxants. These relieve pain caused by sudden muscle tightening (spasms). You may also use a pain medicine that is applied to the skin as a patch, cream, or gel (topical analgesic), such as a numbing medicine. These may cause fewer side effects than medicines taken by mouth. Do certain therapies as directed Some therapies can help with pain management. They include: Physical therapy. You will do exercises to gain strength and flexibility. A physical therapist may teach you exercises to move and stretch parts of your body that are weak, stiff, or painful. You can learn these exercises at physical therapy visits and practice them at home. Physical therapy may also involve: Massage. Heat wraps or applying heat or cold to affected areas. Electrical signals that interrupt pain signals (transcutaneous electrical nerve stimulation, TENS). Weak lasers that reduce pain and swelling (low-level laser therapy). Signals from your body that help you learn to regulate pain (biofeedback). Occupational therapy. This helps you to learn ways to function at home and work with less pain. Recreational therapy. This involves trying new activities or hobbies, such as a physical activity or drawing. Mental health therapy, including: Cognitive behavioral therapy (CBT). This helps you learn coping skills for dealing with pain. Acceptance and commitment therapy (ACT) to change the way you think and react to pain. Relaxation therapies, including muscle relaxation exercises and mindfulness-based stress reduction. Pain management counseling. This may be individual, family, or group counseling.  Receive medical treatments Medical treatments for pain management include: Nerve  block injections. These may include a pain blocker and anti-inflammatory medicines. You may have injections: Near the spine to relieve chronic back or neck pain. Into joints to relieve back or joint pain. Into nerve areas that supply a painful area to relieve body pain. Into muscles (trigger point injections) to relieve some painful muscle conditions. A medical device placed near your spine to help block pain signals and relieve nerve pain or chronic back pain (spinal cord stimulation device). Acupuncture. Follow these instructions at home Medicines Take over-the-counter and prescription medicines only as told by your health care provider. If you are taking pain medicine, ask your health care providers about possible side effects to watch out for. Do not drive or use heavy machinery while taking prescription opioid pain medicine. Lifestyle  Do not use drugs or alcohol to reduce pain. If you drink alcohol, limit how much you have to: 0-1 drink a day for women who are not pregnant. 0-2 drinks a day for men. Know how much alcohol is in a drink. In the [place] one drink equals one 12 oz bottle of beer (355 mL), one 5 oz glass of wine (148 mL), or one 1 oz glass of hard liquor (44 mL). Do not use any products that contain nicotine or tobacco. These products include cigarettes, chewing tobacco, and vaping devices, such as e-cigarettes. If you need help quitting, ask your health care provider. Eat a healthy diet and maintain a healthy weight. Poor diet and excess weight may make pain worse. Eat foods that are high in fiber. These  include fresh fruits and vegetables, whole grains, and beans. Limit foods that are high in fat and processed sugars, such as fried and sweet foods. Exercise regularly. Exercise lowers stress and may help relieve pain. Ask your health care provider what activities and exercises are safe for you. If your health care provider approves, join an exercise class that combines  movement and stress reduction. Examples include yoga and tai chi. Get enough sleep. Lack of sleep may make pain worse. Lower stress as much as possible. Practice stress reduction techniques as told by your therapist. General instructions Work with all your pain management providers to find the treatments that work best for you. You are an important member of your pain management team. There are many things you can do to reduce pain on your own. Consider joining an online or in-person support group for people who have chronic pain. Keep all follow-up visits. This is important. Where to find more information You can find more information about managing pain without opioids from: American Academy of Pain Medicine: painmed.org Institute for Chronic Pain: instituteforchronicpain.org American Chronic Pain Association: theacpa.org Contact a health care provider if: You have side effects from pain medicine. Your pain gets worse or does not get better with treatments or home therapy. You are struggling with anxiety or depression. Summary Many types of pain can be managed without opioids. Chronic pain may respond better to pain management without opioids. Pain is best managed when you and a team of health care providers work together. Pain management without opioids may include non-opioid medicines, medical treatments, physical therapy, mental health therapy, and lifestyle changes. Tell your health care providers if your pain gets worse or is not being managed well enough. This information is not intended to replace advice given to you by your health care provider. Make sure you discuss any questions you have with your health care provider. Document Revised: 01/28/2021 Document Reviewed: 01/28/2021 Elsevier Patient Education  2024 [employer].

## 2024-07-23 NOTE — Telephone Encounter (Signed)
 Performed AWV FYI While reviewing patient's medication list was informed that he is not talking his medications as prescribed. Please see medication list that was updated. Patient stated that he feels like some of the medications may be causing some of his problems. Patient stated that at times he feels dizzy and has some constipation. Patient stated that he has mentioned this to his doctors.

## 2024-07-23 NOTE — Progress Notes (Signed)
 Subjective:   Scott Villanueva is a 63 y.o. who presents for a Medicare Wellness preventive visit.  As a reminder, Annual Wellness Visits don't include a physical exam, and some assessments may be limited, especially if this visit is performed virtually. We may recommend an in-person follow-up visit with your provider if needed.  Visit Complete: Virtual I connected with  [name] Alert on 07/23/24 by a audio enabled telemedicine application and verified that I am speaking with the correct person using two identifiers.  Patient Location: Home  Provider Location: Home Office  I discussed the limitations of evaluation and management by telemedicine. The patient expressed understanding and agreed to proceed.  Vital Signs: Because this visit was a virtual/telehealth visit, some criteria may be missing or patient reported. Any vitals not documented were not able to be obtained and vitals that have been documented are patient reported.  VideoDeclined- This patient declined Librarian, academic. Therefore the visit was completed with audio only.  Persons Participating in Visit: Patient.  AWV Questionnaire: Yes: Patient Medicare AWV questionnaire was completed by the patient on 07/19/24; I have confirmed that all information answered by patient is correct and no changes since this date.  Cardiac Risk Factors include: advanced age (>67men, >29 women);dyslipidemia;hypertension;male gender;obesity (BMI >30kg/m2)     Objective:    Today's Vitals   07/23/24 1301  Weight: 280 lb (127 kg)  Height: 5' 9 (1.753 m)   Body mass index is 41.35 kg/m.     07/23/2024    1:27 PM 07/19/2023    1:11 PM 07/13/2022    1:17 PM 03/20/2021   10:50 AM 10/07/2017   12:10 PM  Advanced Directives  Does Patient Have a Medical Advance Directive? No No No No No   Does patient want to make changes to medical advance directive?    No - Patient declined   Would patient like information  on creating a medical advance directive? No - Patient declined No - Patient declined No - Patient declined  Yes (MAU/Ambulatory/Procedural Areas - Information given)      Data saved with a previous flowsheet row definition    Current Medications (verified) Outpatient Encounter Medications as of 07/23/2024  Medication Sig   albuterol  (ACCUNEB ) 0.63 MG/3ML nebulizer solution Take 3 mLs (0.63 mg total) by nebulization every 6 (six) hours as needed for wheezing.   aspirin  EC 81 MG tablet Take 1 tablet (81 mg total) by mouth daily. (Patient taking differently: Take 81 mg by mouth daily as needed.)   atorvastatin  (LIPITOR) 80 MG tablet Take 1 tablet (80 mg total) by mouth daily at 6 PM. D/c 40 (Patient taking differently: Take 80 mg by mouth daily at 6 PM. D/c 40 Per patient takes as needed)   Boswellia-Glucosamine-Vit D (OSTEO BI-FLEX ONE PER DAY PO) Take by mouth. Take two by mouth daily   CALCIUM  PO Take by mouth. (Patient taking differently: Take by mouth as needed.)   Cholecalciferol (VITAMIN D3) 50 MCG (2000 UT) TABS Take 6,000 tablets by mouth.   Ginkgo Biloba 40 MG TABS Take 1 tablet by mouth daily. (Patient taking differently: Take 1 tablet by mouth daily as needed.)   losartan  (COZAAR ) 50 MG tablet Take 1 tablet (50 mg total) by mouth daily. (Patient taking differently: Take 50 mg by mouth daily as needed.)   methocarbamol  (ROBAXIN ) 500 MG tablet Take 1 tablet (500 mg total) by mouth every 8 (eight) hours as needed for muscle spasms.   metoprolol   succinate (TOPROL -XL) 25 MG 24 hr tablet Take 1 tablet (25 mg total) by mouth daily. (Patient taking differently: Take 25 mg by mouth daily as needed.)   Multiple Vitamin (MULTIVITAMIN WITH MINERALS) TABS tablet Take 1 tablet by mouth daily. (Patient taking differently: Take 1 tablet by mouth daily as needed.)   Omega-3 Fatty Acids (FISH OIL PO) Take by mouth.   traMADol  (ULTRAM ) 50 MG tablet Take 1 tablet (50 mg total) by mouth daily as needed.    Turmeric 500 MG CAPS Take by mouth daily. (Patient taking differently: Take by mouth daily as needed.)   Acetylcysteine (NAC PO) Take by mouth. Take one daily   amLODipine  (NORVASC ) 10 MG tablet Take 1 tablet (10 mg total) by mouth daily. (Patient not taking: Reported on 07/23/2024)   No facility-administered encounter medications on file as of 07/23/2024.    Allergies (verified) No known allergies   History: Past Medical History:  Diagnosis Date   Anginal pain (HCC)    Anxiety 11/01/2016   Arthritis 01/012017   CAD (coronary artery disease)    s/p stent Dr. Claudene; in 40s stents   Difficult intubation    Diverticulitis    Dysrhythmia    HLD (hyperlipidemia)    Hypertension    Myocardial infarction (HCC) 12/20/2006    Feb.2008   Neuromuscular disorder (HCC) 01/14/2015   Obesity    Pneumothorax 2006   Sleep apnea 01/13/2014   CPAP   Past Surgical History:  Procedure Laterality Date   CARDIAC CATHETERIZATION     with stents   CORONARY ANGIOPLASTY  12-20-2006   Surgeon was [place]   JOINT REPLACEMENT  06/15/2018   PLEURAL SCARIFICATION Left 2006   SHOULDER ARTHROSCOPY WITH OPEN ROTATOR CUFF REPAIR Left 10/12/2017   Procedure: SHOULDER ARTHROSCOPY WITH OPEN ROTATOR CUFF REPAIR;  Surgeon: Cleotilde Barrio, MD;  Location: [facility] OR[facility];  Service: Orthopedics;  Laterality: Left;  Left shoulder arthroscopy, subaccromial decompression, distal clavical resection and biceps tenotomy   shoulder joint replacement Left    surgery x 2 Dr. Tanda ortho   TONSILLECTOMY     Family History  Problem Relation Age of Onset   Depression Mother    Anemia Mother        Died from Huntingtons Disease   Thyroid  disease Mother    Huntington's disease Mother    Heart disease Father        Has had several heart surgeries.   CAD Father        s/p bypass    Obesity Father    Huntington's disease Sister    Depression Sister    Schizophrenia Brother    Insomnia Brother    Colon cancer Neg Hx     Esophageal cancer Neg Hx    Pancreatic cancer Neg Hx    Stomach cancer Neg Hx    Liver disease Neg Hx    Social History   Socioeconomic History   Marital status: Married    Spouse name: Not on file   Number of children: Not on file   Years of education: Not on file   Highest education level: Associate degree: occupational, Scientist, product/process development, or vocational program  Occupational History   Not on file  Tobacco Use   Smoking status: Some Days    Current packs/day: 0.00    Types: Cigarettes    Last attempt to quit: 10/07/2010    Years since quitting: 13.8   Smokeless tobacco: Never   Tobacco comments:    Smoke some days 1  pack last for 1- 2 months. 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    Married with 3 kids; as of 12/12/19 wife not working due to DM since 07/2019    Smoker    Social Drivers of Health   Financial Resource Strain: Medium Risk (07/23/2024)   Overall Financial Resource Strain (CARDIA)    Difficulty of Paying Living Expenses: Somewhat hard  Food Insecurity: No Food Insecurity (07/23/2024)   Hunger Vital Sign    Worried About Running Out of Food in the Last Year: Never true    Ran Out of Food in the Last Year: Never true  Transportation Needs: No Transportation Needs (07/23/2024)   PRAPARE - Administrator, Civil Service (Medical): No    Lack of Transportation (Non-Medical): No  Physical Activity: Insufficiently Active (07/23/2024)   Exercise Vital Sign    Days of Exercise per Week: 4 days    Minutes of Exercise  per Session: 30 min  Stress: No Stress Concern Present (07/23/2024)   [employer] of Occupational Health - Occupational Stress Questionnaire    Feeling of Stress: Only a little  Social Connections: Moderately Isolated (07/23/2024)   Social Connection and Isolation Panel    Frequency of Communication with Friends and Family: More than three times a week    Frequency of Social Gatherings with Friends and Family: More than three times a week    Attends Religious Services: Never    Database administrator or Organizations: No    Attends Engineer, structural: Never    Marital Status: Married    Tobacco Counseling Ready to quit: Not Answered Counseling given: Not Answered Tobacco comments: Smoke some days 1 pack last for 1- 2 months. Wants to try to continue to stay away from smoking    Clinical Intake:  Pre-visit preparation completed: Yes  Pain : No/denies pain     BMI - recorded: 41.35 Nutritional Status: BMI > 30  Obese Nutritional Risks: None Diabetes: No  Lab Results  Component Value Date   HGBA1C 6.5 10/20/2023   HGBA1C 6.2 04/19/2023   HGBA1C 6.3 04/06/2022     How often do you need to have someone help you when you read instructions, pamphlets, or other written materials from your doctor or pharmacy?: 1 - Never  Interpreter Needed?: No  Information entered by :: R. Gemayel Mascio LPN   Activities of Daily Living     07/19/2024   10:08 AM  In your present state of health, do you have any difficulty performing the following activities:  Hearing? 0  Vision? 0  Difficulty concentrating or making decisions? 0  Walking or climbing stairs? 1  Dressing or bathing? 1  Doing errands, shopping? 1  Preparing Food and eating ? N  Using the Toilet? N  In the past six months, have you accidently leaked urine? Y  Do you have problems with loss of  bowel control? N  Managing your Medications? N  Managing your Finances? N  Housekeeping or managing your Housekeeping?  N    Patient Care Team: Kaur, Charanpreet, NP as PCP - General (Nurse Practitioner) Gollan, Timothy J, MD as Consulting Physician (Cardiology)  I have updated your Care Teams any recent Medical Services you may have received from other providers in the past year.     Assessment:   This is a routine wellness examination for Landry.  Hearing/Vision screen Hearing Screening - Comments:: No issues Vision Screening - Comments:: glasses   Goals Addressed             This Visit's Progress    Patient Stated       Wants to continue to garden and be able to walk without a crutch       Depression Screen     07/23/2024    1:20 PM 10/20/2023   11:46 AM 07/19/2023    1:03 PM 04/19/2023    3:20 PM 07/13/2022    1:12 PM 04/06/2022    8:17 AM 03/20/2021   10:45 AM  PHQ 2/9 Scores  PHQ - 2 Score 0 0 1 0 0 0 0  PHQ- 9 Score 0 6 5        Fall Risk     07/19/2024   10:08 AM 10/20/2023   11:46 AM 07/19/2023   12:54 PM 04/19/2023    3:19 PM 07/13/2022    1:18 PM  Fall Risk   Falls in the past year? 0 0 0 0 0  Number falls in past yr: 0 0 0 0 0  Injury with Fall? 0 0 0 0   Risk for fall due to : No Fall Risks No Fall Risks No Fall Risks No Fall Risks Impaired balance/gait  Risk for fall due to: Comment     Ambulates with cane and/or crutch  Follow up Falls evaluation completed;Falls prevention discussed Falls evaluation completed Falls prevention discussed;Falls evaluation completed Falls evaluation completed Falls evaluation completed      Data saved with a previous flowsheet row definition    MEDICARE RISK AT HOME:  Medicare Risk at Home Any stairs in or around the home?: (Patient-Rptd) Yes If so, are there any without handrails?: Yes Home free of loose throw rugs in walkways, pet beds, electrical cords, etc?: (Patient-Rptd) Yes Adequate lighting in your home to reduce risk of falls?: (Patient-Rptd) Yes Life alert?: (Patient-Rptd) No Use of a cane, walker or w/c?: (Patient-Rptd)  Yes Grab bars in the bathroom?: (Patient-Rptd) Yes Shower chair or bench in shower?: (Patient-Rptd) Yes Elevated toilet seat or a handicapped toilet?: (Patient-Rptd) No  TIMED UP AND GO:  Was the test performed?  No  Cognitive Function: 6CIT completed        07/23/2024    1:28 PM 07/19/2023    1:11 PM 07/13/2022    1:59 PM  6CIT Screen  What Year? 0 points 0 points 0 points  What month? 0 points 0 points 0 points  What time? 0 points 0 points 0 points  Count back from 20 0 points 0 points 0 points  Months in reverse 0 points 0 points 0 points  Repeat phrase 2 points 2 points 0 points  Total Score 2 points 2 points 0 points    Immunizations Immunization History  Administered Date(s) Administered   Influenza, Seasonal, Injecte, Preservative Fre 10/20/2023   Influenza-Unspecified 07/01/2019, 08/05/2020, 08/11/2021   PFIZER Comirnaty(Gray Top)Covid-19 Tri-Sucrose Vaccine 10/19/2020   PFIZER(Purple  Top)SARS-COV-2 Vaccination 01/29/2020, 02/19/2020, 10/19/2020   PNEUMOCOCCAL CONJUGATE-20 10/20/2023   Pfizer Covid-19 Vaccine Bivalent Booster 66yrs & up 08/11/2021   Tdap 01/15/2020   Zoster Recombinant(Shingrix) 10/23/2020, 01/14/2021    Screening Tests Health Maintenance  Topic Date Due   Hepatitis B Vaccines 19-59 Average Risk (1 of 3 - Risk 3-dose series) Never done   Fecal DNA (Cologuard)  11/04/2023   Influenza Vaccine  06/01/2024   COVID-19 Vaccine (6 - 2025-26 season) 07/02/2024   Medicare Annual Wellness (AWV)  07/18/2024   DTaP/Tdap/Td (2 - Td or Tdap) 01/14/2030   Pneumococcal Vaccine: 50+ Years  Completed   Hepatitis C Screening  Completed   HIV Screening  Completed   Zoster Vaccines- Shingrix  Completed   HPV VACCINES  Aged Out   Meningococcal B Vaccine  Aged Out    Health Maintenance Items Addressed: Discussed the need to update flu and covid vaccines. Patient has an appointment for a colonoscopy consult scheduled for 10/22/24   Additional  Screening:  Vision Screening: Recommended annual ophthalmology exams for early detection of glaucoma and other disorders of the eye. Is the patient up to date with their annual eye exam?  Yes  Who is the provider or what is the name of the office in which the patient attends annual eye exams? [facility]   Dental Screening: Recommended annual dental exams for proper oral hygiene  Community Resource Referral / Chronic Care Management: CRR required this visit?  No   CCM required this visit?  No   Plan:    I have personally reviewed and noted the following in the patient's chart:   Medical and social history Use of alcohol, tobacco or illicit drugs  Current medications and supplements including opioid prescriptions. Patient is currently taking opioid prescriptions. Information provided to patient regarding non-opioid alternatives. Patient advised to discuss non-opioid treatment plan with their provider. Functional ability and status Nutritional status Physical activity Advanced directives List of other physicians Hospitalizations, surgeries, and ER visits in previous 12 months Vitals Screenings to include cognitive, depression, and falls Referrals and appointments  In addition, I have reviewed and discussed with patient certain preventive protocols, quality metrics, and best practice recommendations. A written personalized care plan for preventive services as well as general preventive health recommendations were provided to patient.   Angeline Fredericks, LPN   [id number]   After Visit Summary: (MyChart) Due to this being a telephonic visit, the after visit summary with patients personalized plan was offered to patient via MyChart   Notes: Nothing significant to report at this time.

## 2024-07-24 NOTE — Telephone Encounter (Signed)
 Please see message from Brantley concerning questions about medication.

## 2024-07-27 NOTE — Telephone Encounter (Signed)
 Left message to call the office back to schedule an appointment with Charanpreet Kaur to discuss his medications. Okay to schedule an appointment.

## 2024-07-27 NOTE — Telephone Encounter (Signed)
 Please call pt to schedule an appointment to discuss concerns regarding medication.

## 2024-07-27 NOTE — Telephone Encounter (Signed)
 I also sent a message to patient via MyChart.

## 2024-09-26 ENCOUNTER — Other Ambulatory Visit: Payer: Self-pay | Admitting: Nurse Practitioner

## 2024-09-28 ENCOUNTER — Other Ambulatory Visit: Payer: Self-pay | Admitting: Nurse Practitioner

## 2024-09-28 ENCOUNTER — Other Ambulatory Visit: Payer: Self-pay

## 2024-09-29 ENCOUNTER — Other Ambulatory Visit: Payer: Self-pay

## 2024-10-01 ENCOUNTER — Other Ambulatory Visit: Payer: Self-pay | Admitting: Nurse Practitioner

## 2024-10-01 ENCOUNTER — Other Ambulatory Visit: Payer: Self-pay

## 2024-10-01 MED FILL — Tramadol HCl Tab 50 MG: ORAL | 30 days supply | Qty: 30 | Fill #0 | Status: AC

## 2024-10-01 NOTE — Telephone Encounter (Signed)
Controlled substance database reviewed.  Medication sent to pharmacy.

## 2024-10-01 NOTE — Telephone Encounter (Signed)
 LMTCB

## 2024-10-02 ENCOUNTER — Other Ambulatory Visit: Payer: Self-pay

## 2024-11-02 ENCOUNTER — Other Ambulatory Visit: Payer: Self-pay | Admitting: Gastroenterology

## 2024-11-02 DIAGNOSIS — R1084 Generalized abdominal pain: Secondary | ICD-10-CM

## 2024-11-06 ENCOUNTER — Other Ambulatory Visit

## 2024-11-08 ENCOUNTER — Encounter: Admitting: Nurse Practitioner

## 2024-11-14 ENCOUNTER — Other Ambulatory Visit: Payer: Self-pay

## 2024-11-14 MED FILL — Tramadol HCl Tab 50 MG: ORAL | 30 days supply | Qty: 30 | Fill #1 | Status: AC

## 2024-11-21 ENCOUNTER — Other Ambulatory Visit

## 2024-11-23 ENCOUNTER — Encounter: Admitting: Nurse Practitioner

## 2024-12-28 ENCOUNTER — Encounter: Admitting: Nurse Practitioner

## 2025-07-29 ENCOUNTER — Ambulatory Visit
